# Patient Record
Sex: Female | Born: 1935 | Race: Black or African American | Hispanic: No | Smoking: Never smoker
Health system: Southern US, Community
[De-identification: ages and names within clinical notes are randomized; demographics above are authoritative.]

## PROBLEM LIST (undated history)

## (undated) DIAGNOSIS — R51 Headache: Secondary | ICD-10-CM

## (undated) DIAGNOSIS — N183 Chronic kidney disease, stage 3 unspecified: Secondary | ICD-10-CM

## (undated) DIAGNOSIS — K219 Gastro-esophageal reflux disease without esophagitis: Secondary | ICD-10-CM

## (undated) DIAGNOSIS — R809 Proteinuria, unspecified: Secondary | ICD-10-CM

## (undated) DIAGNOSIS — M7989 Other specified soft tissue disorders: Secondary | ICD-10-CM

## (undated) DIAGNOSIS — C189 Malignant neoplasm of colon, unspecified: Secondary | ICD-10-CM

## (undated) DIAGNOSIS — M1990 Unspecified osteoarthritis, unspecified site: Secondary | ICD-10-CM

## (undated) DIAGNOSIS — I10 Essential (primary) hypertension: Secondary | ICD-10-CM

## (undated) HISTORY — DX: Chronic kidney disease, stage 3 unspecified: N18.30

## (undated) HISTORY — DX: Malignant neoplasm of colon, unspecified: C18.9

## (undated) HISTORY — DX: Essential (primary) hypertension: I10

## (undated) HISTORY — PX: TOTAL ABDOMINAL HYSTERECTOMY W/ BILATERAL SALPINGOOPHORECTOMY: SHX83

## (undated) HISTORY — DX: Chronic kidney disease, stage 3 (moderate): N18.3

## (undated) HISTORY — DX: Proteinuria, unspecified: R80.9

## (undated) HISTORY — DX: Gastro-esophageal reflux disease without esophagitis: K21.9

## (undated) HISTORY — DX: Other specified soft tissue disorders: M79.89

---

## 2001-01-10 ENCOUNTER — Ambulatory Visit (HOSPITAL_COMMUNITY): Admission: RE | Admit: 2001-01-10 | Discharge: 2001-01-10 | Payer: Self-pay | Admitting: Nephrology

## 2001-01-10 ENCOUNTER — Encounter: Payer: Self-pay | Admitting: Nephrology

## 2001-06-20 ENCOUNTER — Encounter: Payer: Self-pay | Admitting: Family Medicine

## 2001-06-20 ENCOUNTER — Ambulatory Visit (HOSPITAL_COMMUNITY): Admission: RE | Admit: 2001-06-20 | Discharge: 2001-06-20 | Payer: Self-pay | Admitting: Family Medicine

## 2001-06-25 ENCOUNTER — Ambulatory Visit (HOSPITAL_COMMUNITY): Admission: RE | Admit: 2001-06-25 | Discharge: 2001-06-25 | Payer: Self-pay | Admitting: Family Medicine

## 2001-06-25 ENCOUNTER — Encounter: Payer: Self-pay | Admitting: Family Medicine

## 2001-06-28 ENCOUNTER — Encounter: Payer: Self-pay | Admitting: General Surgery

## 2001-06-28 ENCOUNTER — Ambulatory Visit (HOSPITAL_COMMUNITY): Admission: RE | Admit: 2001-06-28 | Discharge: 2001-06-28 | Payer: Self-pay | Admitting: General Surgery

## 2001-11-25 ENCOUNTER — Encounter (HOSPITAL_COMMUNITY): Admission: RE | Admit: 2001-11-25 | Discharge: 2001-12-25 | Payer: Self-pay | Admitting: Internal Medicine

## 2001-12-09 ENCOUNTER — Encounter (HOSPITAL_COMMUNITY): Admission: RE | Admit: 2001-12-09 | Discharge: 2002-01-08 | Payer: Self-pay | Admitting: Internal Medicine

## 2001-12-30 ENCOUNTER — Emergency Department (HOSPITAL_COMMUNITY): Admission: EM | Admit: 2001-12-30 | Discharge: 2001-12-31 | Payer: Self-pay | Admitting: *Deleted

## 2002-02-25 ENCOUNTER — Ambulatory Visit (HOSPITAL_COMMUNITY): Admission: RE | Admit: 2002-02-25 | Discharge: 2002-02-25 | Payer: Self-pay | Admitting: Internal Medicine

## 2002-03-25 ENCOUNTER — Encounter: Payer: Self-pay | Admitting: Family Medicine

## 2002-03-25 ENCOUNTER — Ambulatory Visit (HOSPITAL_COMMUNITY): Admission: RE | Admit: 2002-03-25 | Discharge: 2002-03-25 | Payer: Self-pay | Admitting: Family Medicine

## 2002-06-27 ENCOUNTER — Emergency Department (HOSPITAL_COMMUNITY): Admission: EM | Admit: 2002-06-27 | Discharge: 2002-06-28 | Payer: Self-pay | Admitting: *Deleted

## 2002-06-27 ENCOUNTER — Encounter: Payer: Self-pay | Admitting: *Deleted

## 2003-01-09 ENCOUNTER — Emergency Department (HOSPITAL_COMMUNITY): Admission: EM | Admit: 2003-01-09 | Discharge: 2003-01-09 | Payer: Self-pay | Admitting: Emergency Medicine

## 2003-01-30 ENCOUNTER — Ambulatory Visit (HOSPITAL_COMMUNITY): Admission: RE | Admit: 2003-01-30 | Discharge: 2003-01-30 | Payer: Self-pay | Admitting: Family Medicine

## 2003-04-17 ENCOUNTER — Ambulatory Visit (HOSPITAL_COMMUNITY): Admission: RE | Admit: 2003-04-17 | Discharge: 2003-04-17 | Payer: Self-pay | Admitting: Family Medicine

## 2004-10-26 ENCOUNTER — Other Ambulatory Visit: Admission: RE | Admit: 2004-10-26 | Discharge: 2004-10-26 | Payer: Self-pay | Admitting: Internal Medicine

## 2006-04-18 ENCOUNTER — Ambulatory Visit (HOSPITAL_COMMUNITY): Admission: RE | Admit: 2006-04-18 | Discharge: 2006-04-18 | Payer: Self-pay | Admitting: Family Medicine

## 2006-05-14 ENCOUNTER — Ambulatory Visit: Payer: Self-pay | Admitting: Vascular Surgery

## 2007-11-07 ENCOUNTER — Ambulatory Visit (HOSPITAL_COMMUNITY): Admission: RE | Admit: 2007-11-07 | Discharge: 2007-11-07 | Payer: Self-pay | Admitting: Internal Medicine

## 2008-03-12 ENCOUNTER — Emergency Department (HOSPITAL_COMMUNITY): Admission: EM | Admit: 2008-03-12 | Discharge: 2008-03-12 | Payer: Self-pay | Admitting: Emergency Medicine

## 2009-12-15 ENCOUNTER — Ambulatory Visit (HOSPITAL_COMMUNITY): Admission: RE | Admit: 2009-12-15 | Discharge: 2009-12-15 | Payer: Self-pay | Admitting: Internal Medicine

## 2009-12-22 ENCOUNTER — Ambulatory Visit: Payer: Self-pay | Admitting: Cardiology

## 2009-12-22 DIAGNOSIS — R12 Heartburn: Secondary | ICD-10-CM

## 2009-12-22 DIAGNOSIS — I10 Essential (primary) hypertension: Secondary | ICD-10-CM | POA: Insufficient documentation

## 2009-12-22 DIAGNOSIS — R011 Cardiac murmur, unspecified: Secondary | ICD-10-CM

## 2009-12-22 DIAGNOSIS — R079 Chest pain, unspecified: Secondary | ICD-10-CM

## 2009-12-23 ENCOUNTER — Encounter: Payer: Self-pay | Admitting: Internal Medicine

## 2009-12-28 ENCOUNTER — Encounter: Payer: Self-pay | Admitting: Cardiology

## 2009-12-28 ENCOUNTER — Ambulatory Visit: Payer: Self-pay | Admitting: Cardiology

## 2009-12-28 ENCOUNTER — Ambulatory Visit (HOSPITAL_COMMUNITY): Admission: RE | Admit: 2009-12-28 | Discharge: 2009-12-28 | Payer: Self-pay | Admitting: Cardiology

## 2009-12-31 ENCOUNTER — Encounter: Payer: Self-pay | Admitting: Cardiology

## 2010-01-27 ENCOUNTER — Ambulatory Visit: Payer: Self-pay | Admitting: Cardiology

## 2010-04-11 ENCOUNTER — Encounter: Payer: Self-pay | Admitting: Internal Medicine

## 2010-04-19 NOTE — Assessment & Plan Note (Signed)
Summary: 1 mth f/u per checkout on 12/22/09/tg   Visit Type:  Follow-up Primary Provider:  Dr. Elfredia Nevins   History of Present Illness: 77 year old woman presents for followup. She was seen back in October in the setting of chest discomfort as outlined previously. Symptoms were fairly atypical, possibly gastrointestinal in etiology. Antiacid medication was recommended. Patient was also referred for a followup echocardiogram as reviewed below.  She reports improvement in symptoms, no progressive chest pain or breathlessness. Continues to have problems with chronic lower extremity edema, on antihypertensives under the direction of Dr. Kristian Covey.  Today I reviewed her echocardiogram. At this point would anticipate medical therapy and observation, no further cardiac testing.  Current Medications (verified): 1)  Amlodipine Besylate 10 Mg Tabs (Amlodipine Besylate) .... Take 1/2 Tab Daily 2)  Captopril 50 Mg Tabs (Captopril) .... Take 1 1/2 Tabs Daily 3)  Furosemide 20 Mg Tabs (Furosemide) .... Take Prn 4)  Losartan Potassium-Hctz 100-25 Mg Tabs (Losartan Potassium-Hctz) .... Take 1 Tab Daily 5)  Acetaminophen 500 Mg Caps (Acetaminophen) .... As Needed  Allergies: 1)  ! Sulfa 2)  ! Pcn 3)  ! Asa 4)  ! Codeine  Past History:  Past Medical History: Last updated: 12/22/2009 Proteinuria Hypertension Possible G E R D  Social History: Last updated: 12/21/2009 Tobacco Use - No.  Alcohol Use - no Regular Exercise - no Drug Use - no  Review of Systems       The patient complains of peripheral edema.  The patient denies anorexia, fever, chest pain, syncope, dyspnea on exertion, melena, and hematochezia.         Otherwise reviewed and negative.  Vital Signs:  Patient profile:   77 year old female Weight:      198 pounds O2 Sat:      98 % on Room air Pulse rate:   76 / minute BP sitting:   150 / 65  (left arm)  Vitals Entered By: Larita Fife Via LPN (January 27, 2010 1:25 PM)  O2  Flow:  Room air  Physical Exam  Additional Exam:  Overweight woman in no acute distress. HEENT: Conjunctiva and lids normal, oropharynx clear. Neck: Supple, no elevated JVP or carotid bruits, no thyromegaly. Lungs: Clear to auscultation, nonlabored. Cardiac: Regular rate and rhythm, 2/6 systolic murmur at the base, preserved second heart sound, no S3 gallop or rub. Musculoskeletal: No kyphosis. Extremities: Chronic appearing bilateral lower extremity edema and venous stasis, progressive since last visit. Neuropsychiatric: Alert and oriented x3, affect appropriate.   Echocardiogram  Procedure date:  12/28/2009  Findings:      Study Conclusions            - Left ventricle: The cavity size was normal.with borderline LVH.       Systolic function was hyperdynamic. The estimated ejection       fraction was 75%. Wall motion was normal; there were no regional       wall motion abnormalities.     - Aortic valve: Mildly calcified annulus. Trileaflet; normal       thickness leaflets.     - Atrial septum: There was a medium-sized atrial septal aneurysm.  Impression & Recommendations:  Problem # 1:  CHEST PAIN (ICD-786.50)  Atypical, resolved. Suspect gastrointestinal etiology such as reflux. LVEF is normal by echocardiogram. Anticipate observation on medical therapy. No further cardiac testing at this time. We can see her back as needed.  Her updated medication list for this problem includes:    Amlodipine Besylate  10 Mg Tabs (Amlodipine besylate) .Marland Kitchen... Take 1/2 tab daily    Captopril 50 Mg Tabs (Captopril) .Marland Kitchen... Take 1 1/2 tabs daily  Problem # 2:  CARDIAC MURMUR (ICD-785.2)  Normal LVEF without significant valvular abnormalities. Likely flow murmur in the setting of hypertension, borderline LVH, and hyperdynamic contraction.  Her updated medication list for this problem includes:    Captopril 50 Mg Tabs (Captopril) .Marland Kitchen... Take 1 1/2 tabs daily    Furosemide 20 Mg Tabs (Furosemide)  .Marland Kitchen... Take prn    Losartan Potassium-hctz 100-25 Mg Tabs (Losartan potassium-hctz) .Marland Kitchen... Take 1 tab daily  Patient Instructions: 1)  Your physician recommends that you schedule a follow-up appointment in: as needed  2)  Your physician recommends that you continue on your current medications as directed. Please refer to the Current Medication list given to you today.  Prevention & Chronic Care Immunizations   Influenza vaccine: Not documented    Tetanus booster: Not documented    Pneumococcal vaccine: Not documented    H. zoster vaccine: Not documented  Colorectal Screening   Hemoccult: Not documented    Colonoscopy: Not documented  Other Screening   Pap smear: Not documented    Mammogram: Not documented    DXA bone density scan: Not documented   Smoking status: never  (12/21/2009)  Lipids   Total Cholesterol: Not documented   LDL: Not documented   LDL Direct: Not documented   HDL: Not documented   Triglycerides: Not documented  Hypertension   Last Blood Pressure: 150 / 65  (01/27/2010)   Serum creatinine: Not documented   Serum potassium Not documented  Self-Management Support :    Hypertension self-management support: Not documented

## 2010-04-19 NOTE — Letter (Signed)
Summary: [place] Results Engineer, agricultural at [facility]  [address], [place] [address]   Phone: [phone]  Fax: [phone]      December 31, 2009 MRN: [id number]   Vicki Woods [address] [place], [place]  [address]   Dear [name],  Your test ordered by Selena Batten has been reviewed by your physician (or physician assistant) and was found to be normal or stable. Your physician (or physician assistant) felt no changes were needed at this time.  __X__ Echocardiogram  ____ Cardiac Stress Test  ____ Lab Work  ____ Peripheral vascular study of arms, legs or neck  ____ CT scan or X-ray  ____ Lung or Breathing test  ____ Other:  Please continue on current medical treatment.  Thank you.   Nona Dell, MD, F.A.C.C

## 2010-04-19 NOTE — Assessment & Plan Note (Signed)
Summary: ***NP36 CHEST PAIN   Visit Type:  Initial Consult Primary Provider:  Dr. Elfredia Nevins   History of Present Illness: 77 year old woman referred for cardiology consultation. She reports recent 2 week history of intermittent "heaviness" in her mid thoracic to neck area, made better by stirring around and moving, as well as belching. She also describes a "bubbly" feeling in this area. She has been suspicious that it might be related to "reflux.  She does not endorse any exertional component to these symptoms, describing herself as still being very active. She push mows her yard with a self-propelled mower, also does all of her indoor cleaning and activities of daily living. Reports no exertional chest pain or unusual shortness of breath beyond NYHA class II. No palpitations or syncope.  She states that she saw Dr. Dietrich Pates many years ago and underwent stress testing related to findings of a right bundle branch block on electrocardiogram. She reports no specific problems over time or need for recurrent cardiac testing. Record review finds report of an EKG from April 2004 showing right bundle branch block.  She was given Gas-X for treatment, has not taken an acid lowering medicine as yet  Current Medications (verified): 1)  Amlodipine Besylate 10 Mg Tabs (Amlodipine Besylate) .... Take 1/2 Tab Daily 2)  Captopril 50 Mg Tabs (Captopril) .... Take 1 1/2 Tabs Daily 3)  Furosemide 20 Mg Tabs (Furosemide) .... Take Prn 4)  Losartan Potassium-Hctz 100-25 Mg Tabs (Losartan Potassium-Hctz) .... Take 1 Tab Daily 5)  Clobetasol Propionate 0.05 % Crea (Clobetasol Propionate) .... Apply As Needed  Allergies (verified): 1)  ! Sulfa 2)  ! Pcn 3)  ! Asa 4)  ! Codeine  Comments:  Nurse/Medical Assistant: patient brought meds she has 10 mg of amlodipine she only takes 1/2 of the tab daily,Dr.befakadu has patient on furosemide 20 mg she only takes this med as needed .   Past History:  Family  History: Last updated: 12/22/2009 Noncontributory for premature cardiovascular disease Mother: died with ovarian cancer Siblings: Brother died with lung cancer  Social History: Last updated: 12/21/2009 Tobacco Use - No.  Alcohol Use - no Regular Exercise - no Drug Use - no  Past Medical History: Proteinuria Hypertension Possible G E R D  Past Surgical History: Hysterectomy Bilateral salpingo-oophorectomy  Family History: Noncontributory for premature cardiovascular disease Mother: died with ovarian cancer Siblings: Brother died with lung cancer  Review of Systems       The patient complains of peripheral edema.  The patient denies anorexia, fever, weight loss, syncope, dyspnea on exertion, prolonged cough, hemoptysis, melena, and hematochezia.         Reports chronic problems with lower extremity edema. Also proteinuria, followed by Dr. Kristian Covey. Otherwise reviewed and negative except as outlined above.  Vital Signs:  Patient profile:   77 year old female Height:      60 inches Weight:      196 pounds BMI:     38.42 Pulse rate:   83 / minute BP sitting:   179 / 74  (right arm)  Vitals Entered By: Dreama Saa, CNA (December 22, 2009 9:30 AM)  Physical Exam  Additional Exam:  Overweight woman in no acute distress. HEENT: Conjunctiva and lids normal, oropharynx clear. Neck: Supple, no elevated JVP or carotid bruits, no thyromegaly. Lungs: Clear to auscultation, nonlabored. Cardiac: Regular rate and rhythm, 2/6 systolic murmur at the base, preserved second heart sound, no S3 gallop or rub. Abdomen: Soft, nontender, bowel sounds  present. Skin: Warm and dry. Musculoskeletal: No kyphosis. Extremities: Trace ankle edema, symmetrical, distal pulses full. Neuropsychiatric: Alert and oriented x3, affect appropriate.   CXR  Procedure date:  12/15/2009  Findings:       Findings:   Nonobstructive bowel gas pattern.   Air filled nondistended loops of large and small  bowel.   Gas and stool present in rectum.   No bowel wall thickening.   Mild osseous demineralization.   No urinary tract calcification.    IMPRESSION:   Normal bowel gas pattern.  Carotid Doppler  Procedure date:  06/20/2001  Findings:      IMPRESSION:  1.  FOCAL, CALCIFIED PLAQUE IN THE PROXIMAL   LEFT INTERNAL CAROTID ARTERY WITHOUT SIGNIFICANT LUMINAL   NARROWING OR FLOW REDUCTION.   2.  OTHERWISE, NORMAL APPEARING CAROTID AND VERTEBRAL   ARTERIES.  EKG  Procedure date:  12/22/2009  Findings:      Sinus rhythm at 75 beats per minute with left atrial enlargement and right bundle branch block pattern.  Impression & Recommendations:  Problem # 1:  CHEST PAIN (ICD-786.50)  Atypical in description, seems most consistent with a gastrointestinal etiology, perhaps reflux. She does not endorse any truly exertional symptoms. Electrocardiogram is abnormal chronically showing right bundle branch block pattern. I asked her to try an over-the-counter antacid medication over the next few weeks to see if this helps improve her symptoms. Office follow up arranged in the month for review. At this point no further ischemic testing is being pursued.  Her updated medication list for this problem includes:    Amlodipine Besylate 10 Mg Tabs (Amlodipine besylate) .Marland Kitchen... Take 1/2 tab daily    Captopril 50 Mg Tabs (Captopril) .Marland Kitchen... Take 1 1/2 tabs daily  Orders: 2-D Echocardiogram (2D Echo)  Problem # 2:  HYPERTENSION (ICD-401.9)  Followed by Dr. Sherwood Gambler and Dr. Kristian Covey.  Her updated medication list for this problem includes:    Amlodipine Besylate 10 Mg Tabs (Amlodipine besylate) .Marland Kitchen... Take 1/2 tab daily    Captopril 50 Mg Tabs (Captopril) .Marland Kitchen... Take 1 1/2 tabs daily    Furosemide 20 Mg Tabs (Furosemide) .Marland Kitchen... Take prn    Losartan Potassium-hctz 100-25 Mg Tabs (Losartan potassium-hctz) .Marland Kitchen... Take 1 tab daily  Problem # 3:  CARDIAC MURMUR (ICD-785.2)  A followup 2-D echocardiogram will  be obtained.  Her updated medication list for this problem includes:    Captopril 50 Mg Tabs (Captopril) .Marland Kitchen... Take 1 1/2 tabs daily    Furosemide 20 Mg Tabs (Furosemide) .Marland Kitchen... Take prn    Losartan Potassium-hctz 100-25 Mg Tabs (Losartan potassium-hctz) .Marland Kitchen... Take 1 tab daily  Patient Instructions: 1)  Your physician recommends that you schedule a follow-up appointment in: 1 month 2)  Your physician has recommended you make the following change in your medication: Start taking an over the counter antacid such as  Pepsid, Zantac, Tagamet, Omeprazole, Tums, etc. 3)  Your physician has requested that you have an echocardiogram.  Echocardiography is a painless test that uses sound waves to create images of your heart. It provides your doctor with information about the size and shape of your heart and how well your heart's chambers and valves are working.  This procedure takes approximately one hour. There are no restrictions for this procedure.

## 2010-08-05 NOTE — Procedures (Signed)
   NAME:  Vicki Woods, Vicki Woods                      ACCOUNT NO.:  [id number]   MEDICAL RECORD NO.:  [id number]                   PATIENT TYPE:  EMS   LOCATION:  ED                                   FACILITY:  [facility]   PHYSICIAN:  Edward L. Juanetta Gosling, M.D.             DATE OF BIRTH:  24-Jul-1931   DATE OF PROCEDURE:  06/27/2002  DATE OF DISCHARGE:  06/28/2002                                EKG INTERPRETATION   DATE AND TIME OF TEST:  June 27, 2002 at 2243.   FINDINGS:  The rhythm is sinus rhythm with a rate in the 80s.  There is a  right bundle-branch block.  There are also PVCs.   IMPRESSION:  Abnormal electrocardiogram.                                               Edward L. Juanetta Gosling, M.D.    ELH/MEDQ  D:  06/30/2002  T:  06/30/2002  Job:  [id number]

## 2010-08-05 NOTE — Procedures (Signed)
   NAME:  CLOTEE, SCHLICKER                      ACCOUNT NO.:  [id number]   MEDICAL RECORD NO.:  [id number]                   PATIENT TYPE:  EMS   LOCATION:  ED                                   FACILITY:  [facility]   PHYSICIAN:  Edward L. Juanetta Gosling, M.D.             DATE OF BIRTH:  [birth year]   DATE OF PROCEDURE:  06/28/2002  DATE OF DISCHARGE:  06/28/2002                                EKG INTERPRETATION   DATE AND TIME OF TEST:  June 28, 2002 at 0010.   FINDINGS:  The rhythm is sinus rhythm with a rate in the 80s.  There is a  right bundle-branch block.   IMPRESSION:  Abnormal electrocardiogram.                                               Edward L. Juanetta Gosling, M.D.    ELH/MEDQ  D:  07/03/2002  T:  07/04/2002  Job:  [id number]

## 2011-03-16 ENCOUNTER — Encounter: Payer: Self-pay | Admitting: Cardiology

## 2011-06-19 ENCOUNTER — Other Ambulatory Visit: Payer: Self-pay

## 2011-06-19 ENCOUNTER — Encounter (HOSPITAL_COMMUNITY): Payer: Self-pay | Admitting: *Deleted

## 2011-06-19 ENCOUNTER — Inpatient Hospital Stay (HOSPITAL_COMMUNITY)
Admission: EM | Admit: 2011-06-19 | Discharge: 2011-06-23 | DRG: 375 | Disposition: A | Discharge status: Home / Self Care | Payer: Medicare Other | Attending: Internal Medicine | Admitting: Internal Medicine

## 2011-06-19 DIAGNOSIS — E669 Obesity, unspecified: Secondary | ICD-10-CM | POA: Diagnosis present

## 2011-06-19 DIAGNOSIS — C183 Malignant neoplasm of hepatic flexure: Principal | ICD-10-CM | POA: Diagnosis present

## 2011-06-19 DIAGNOSIS — K7689 Other specified diseases of liver: Secondary | ICD-10-CM | POA: Diagnosis present

## 2011-06-19 DIAGNOSIS — D649 Anemia, unspecified: Secondary | ICD-10-CM | POA: Diagnosis present

## 2011-06-19 DIAGNOSIS — K922 Gastrointestinal hemorrhage, unspecified: Secondary | ICD-10-CM | POA: Diagnosis present

## 2011-06-19 DIAGNOSIS — K2100 Gastro-esophageal reflux disease with esophagitis, without bleeding: Secondary | ICD-10-CM | POA: Diagnosis present

## 2011-06-19 DIAGNOSIS — E876 Hypokalemia: Secondary | ICD-10-CM | POA: Diagnosis present

## 2011-06-19 DIAGNOSIS — I10 Essential (primary) hypertension: Secondary | ICD-10-CM | POA: Diagnosis present

## 2011-06-19 DIAGNOSIS — D128 Benign neoplasm of rectum: Secondary | ICD-10-CM | POA: Diagnosis present

## 2011-06-19 DIAGNOSIS — D126 Benign neoplasm of colon, unspecified: Secondary | ICD-10-CM | POA: Diagnosis present

## 2011-06-19 DIAGNOSIS — Z6835 Body mass index (BMI) 35.0-35.9, adult: Secondary | ICD-10-CM

## 2011-06-19 DIAGNOSIS — D62 Acute posthemorrhagic anemia: Secondary | ICD-10-CM | POA: Diagnosis present

## 2011-06-19 DIAGNOSIS — Z79899 Other long term (current) drug therapy: Secondary | ICD-10-CM

## 2011-06-19 DIAGNOSIS — D131 Benign neoplasm of stomach: Secondary | ICD-10-CM | POA: Diagnosis present

## 2011-06-19 DIAGNOSIS — K5730 Diverticulosis of large intestine without perforation or abscess without bleeding: Secondary | ICD-10-CM | POA: Diagnosis present

## 2011-06-19 DIAGNOSIS — Z880 Allergy status to penicillin: Secondary | ICD-10-CM

## 2011-06-19 HISTORY — DX: Headache: R51

## 2011-06-19 HISTORY — DX: Unspecified osteoarthritis, unspecified site: M19.90

## 2011-06-19 LAB — COMPREHENSIVE METABOLIC PANEL
Albumin: 4 g/dL (ref 3.5–5.2)
BUN: 20 mg/dL (ref 6–23)
CO2: 25 mEq/L (ref 19–32)
Calcium: 10.1 mg/dL (ref 8.4–10.5)
Chloride: 105 mEq/L (ref 96–112)
Creatinine, Ser: 0.86 mg/dL (ref 0.50–1.10)
GFR calc Af Amer: 74 mL/min — ABNORMAL LOW (ref >90)
Total Bilirubin: 0.4 mg/dL (ref 0.3–1.2)

## 2011-06-19 LAB — CBC
HCT: 33.3 % — ABNORMAL LOW (ref 36.0–46.0)
Hemoglobin: 9.8 g/dL — ABNORMAL LOW (ref 12.0–15.0)
MCH: 28.7 pg (ref 26.0–34.0)
MCV: 87.6 fL (ref 78.0–100.0)
MCV: 87.1 fL (ref 78.0–100.0)
Platelets: 224 10*3/uL (ref 150–400)
RBC: 3.33 MIL/uL — ABNORMAL LOW (ref 3.87–5.11)
RDW: 16.4 % — ABNORMAL HIGH (ref 11.5–15.5)
WBC: 8.5 10*3/uL (ref 4.0–10.5)
WBC: 7.2 10*3/uL (ref 4.0–10.5)

## 2011-06-19 LAB — TYPE AND SCREEN
DAT, IgG: NEGATIVE
PT AG Type: NEGATIVE

## 2011-06-19 MED ORDER — SODIUM CHLORIDE 0.9 % IV SOLN: INTRAVENOUS | Status: DC

## 2011-06-19 MED ORDER — SODIUM CHLORIDE 0.9 % IJ SOLN
INTRAMUSCULAR | Status: AC
  Administered 2011-06-19: 10 mL
  Filled 2011-06-19: qty 3

## 2011-06-19 MED ORDER — SODIUM CHLORIDE 0.9 % IJ SOLN
3.0000 mL | Freq: Two times a day (BID) | INTRAMUSCULAR | Status: DC
  Administered 2011-06-19 – 2011-06-23 (×8): 3 mL via INTRAVENOUS
  Filled 2011-06-19 (×9): qty 3

## 2011-06-19 MED ORDER — AMLODIPINE BESYLATE 5 MG PO TABS
5.0000 mg | ORAL_TABLET | Freq: Every day | ORAL | Status: DC
  Administered 2011-06-19 – 2011-06-23 (×5): 5 mg via ORAL
  Filled 2011-06-19 (×5): qty 1

## 2011-06-19 MED ORDER — PANTOPRAZOLE SODIUM 40 MG IV SOLR
40.0000 mg | Freq: Two times a day (BID) | INTRAVENOUS | Status: DC
  Administered 2011-06-19 – 2011-06-23 (×8): 40 mg via INTRAVENOUS
  Filled 2011-06-19 (×8): qty 40

## 2011-06-19 MED ORDER — ONDANSETRON HCL 4 MG/2ML IJ SOLN: 4.0000 mg | Freq: Four times a day (QID) | INTRAMUSCULAR | Status: DC | PRN

## 2011-06-19 MED ORDER — LOSARTAN POTASSIUM-HCTZ 100-25 MG PO TABS: 1.0000 | ORAL_TABLET | Freq: Every day | ORAL | Status: DC

## 2011-06-19 MED ORDER — HYDROCHLOROTHIAZIDE 25 MG PO TABS
25.0000 mg | ORAL_TABLET | Freq: Every day | ORAL | Status: DC
  Administered 2011-06-19 – 2011-06-23 (×5): 25 mg via ORAL
  Filled 2011-06-19 (×5): qty 1

## 2011-06-19 MED ORDER — LOSARTAN POTASSIUM 50 MG PO TABS
100.0000 mg | ORAL_TABLET | Freq: Every day | ORAL | Status: DC
  Administered 2011-06-19 – 2011-06-23 (×5): 100 mg via ORAL
  Filled 2011-06-19 (×5): qty 2

## 2011-06-19 MED ORDER — ACETAMINOPHEN 650 MG RE SUPP: 650.0000 mg | Freq: Four times a day (QID) | RECTAL | Status: DC | PRN

## 2011-06-19 MED ORDER — ACETAMINOPHEN 325 MG PO TABS: 650.0000 mg | ORAL_TABLET | Freq: Four times a day (QID) | ORAL | Status: DC | PRN

## 2011-06-19 MED ORDER — SODIUM CHLORIDE 0.9 % IV SOLN
INTRAVENOUS | Status: DC
  Administered 2011-06-19: 17:00:00 via INTRAVENOUS

## 2011-06-19 MED ORDER — POTASSIUM CHLORIDE CRYS ER 20 MEQ PO TBCR
40.0000 meq | EXTENDED_RELEASE_TABLET | Freq: Once | ORAL | Status: AC
  Administered 2011-06-19: 40 meq via ORAL
  Filled 2011-06-19: qty 1

## 2011-06-19 MED ORDER — SODIUM CHLORIDE 0.9 % IV SOLN
80.0000 mg | Freq: Once | INTRAVENOUS | Status: AC
  Administered 2011-06-19: 80 mg via INTRAVENOUS
  Filled 2011-06-19: qty 80

## 2011-06-19 MED ORDER — CAPTOPRIL 25 MG PO TABS
75.0000 mg | ORAL_TABLET | Freq: Every day | ORAL | Status: DC
  Administered 2011-06-20 – 2011-06-23 (×4): 75 mg via ORAL
  Filled 2011-06-19 (×4): qty 3

## 2011-06-19 MED ORDER — ONDANSETRON HCL 4 MG PO TABS: 4.0000 mg | ORAL_TABLET | Freq: Four times a day (QID) | ORAL | Status: DC | PRN

## 2011-06-19 MED ORDER — PANTOPRAZOLE SODIUM 40 MG IV SOLR
INTRAVENOUS | Status: AC
  Filled 2011-06-19: qty 80

## 2011-06-19 NOTE — ED Notes (Signed)
Patient is comfortable. Patient was assisted to the bathroom and back to bed. Tolerated well.

## 2011-06-19 NOTE — Plan of Care (Signed)
Problem: Phase I Progression Outcomes Goal: OOB as tolerated unless otherwise ordered Outcome: Completed/Met Date Met:  06/19/11 With one person contact assist

## 2011-06-19 NOTE — ED Provider Notes (Addendum)
History   This chart was scribed for Hilario Quarry, MD by Brooks Sailors. The patient was seen in room APA09/APA09. Patient's care was started at 1028.   CSN: [id number]  Arrival date & time 06/19/11  1028   First MD Initiated Contact with Patient 06/19/11 1151      Chief Complaint  Patient presents with  . Rectal Bleeding    (Consider location/radiation/quality/duration/timing/severity/associated sxs/prior treatment) HPI  Vicki Woods is a 78 y.o. female who presents to the Emergency Department complaining of constant moderate blood in stool onset four days ago and persistent since. Patient has had around two bowel movements each day always with blood mixed in. Denies diabetes and soreness. Patient had history of hemorrhoids 30 years ago. Patient with history of GERD, HTN.      Past Medical History  Diagnosis Date  . Hypertension   . GERD (gastroesophageal reflux disease)   . Proteinuria     Past Surgical History  Procedure Date  . Total abdominal hysterectomy w/ bilateral salpingoophorectomy     Family History  Problem Relation Age of Onset  . Ovarian cancer Mother   . Lung cancer Brother     History  Substance Use Topics  . Smoking status: Unknown If Ever Smoked  . Smokeless tobacco: Not on file  . Alcohol Use: No    OB History    Grav Para Term Preterm Abortions TAB SAB Ect Mult Living                  Review of Systems A complete 10 system review of systems was obtained and all systems are negative except as noted in the HPI and PMH.   Allergies  Aspirin; Codeine; Penicillins; and Sulfonamide derivatives  Home Medications   Current Outpatient Rx  Name Route Sig Dispense Refill  . ACETAMINOPHEN 500 MG PO TABS Oral Take 500 mg by mouth as needed.      Marland Kitchen AMLODIPINE BESYLATE 10 MG PO TABS Oral Take 5 mg by mouth daily.      Marland Kitchen CAPTOPRIL 50 MG PO TABS Oral Take 75 mg by mouth daily.      . FUROSEMIDE 20 MG PO TABS Oral Take 20 mg by mouth as  needed.      Marland Kitchen LOSARTAN POTASSIUM-HCTZ 100-25 MG PO TABS Oral Take 1 tablet by mouth daily.        BP 182/68  Pulse 80  Temp(Src) 97.7 F (36.5 C) (Oral)  Resp 20  Ht 5' (1.524 m)  Wt 180 lb (81.647 kg)  BMI 35.15 kg/m2  SpO2 100%  Physical Exam  Nursing note and vitals reviewed. Constitutional: She is oriented to person, place, and time. She appears well-developed and well-nourished. No distress.  HENT:  Head: Normocephalic and atraumatic.  Eyes: EOM are normal. Pupils are equal, round, and reactive to light.  Neck: Neck supple. No tracheal deviation present.  Cardiovascular: Normal rate, regular rhythm and normal heart sounds.   Pulmonary/Chest: Effort normal and breath sounds normal. No respiratory distress. She has no wheezes.  Abdominal: Soft. She exhibits no distension.  Musculoskeletal: Normal range of motion. She exhibits edema (Chronic, lower extremities ).  Neurological: She is alert and oriented to person, place, and time. No sensory deficit.  Skin: Skin is warm and dry.  Psychiatric: She has a normal mood and affect. Her behavior is normal.    ED Course  Procedures (including critical care time) DIAGNOSTIC STUDIES: Oxygen Saturation is 100% on room air, normal  by my interpretation.    COORDINATION OF CARE: 11:55AM - Patient informed of current plan for treatment and evaluation and agrees with plan at this time.     Labs Reviewed - No data to display No results found.   No diagnosis found.    MDM    Results for orders placed during the hospital encounter of 06/19/11  OCCULT BLOOD, POC DEVICE      Component Value Range   Fecal Occult Bld POSITIVE    PROTIME-INR      Component Value Range   Prothrombin Time 13.4  11.6 - 15.2 (seconds)   INR 1.00  0.00 - 1.49   CBC      Component Value Range   WBC 8.5  4.0 - 10.5 (K/uL)   RBC 3.80 (*) 3.87 - 5.11 (MIL/uL)   Hemoglobin 10.9 (*) 12.0 - 15.0 (g/dL)   HCT 16.1 (*) 09.6 - 46.0 (%)   MCV 87.6  78.0 -  100.0 (fL)   MCH 28.7  26.0 - 34.0 (pg)   MCHC 32.7  30.0 - 36.0 (g/dL)   RDW 04.5 (*) 40.9 - 15.5 (%)   Platelets 271  150 - 400 (K/uL)  COMPREHENSIVE METABOLIC PANEL      Component Value Range   Sodium 141  135 - 145 (mEq/L)   Potassium 3.4 (*) 3.5 - 5.1 (mEq/L)   Chloride 105  96 - 112 (mEq/L)   CO2 25  19 - 32 (mEq/L)   Glucose, Bld 95  70 - 99 (mg/dL)   BUN 20  6 - 23 (mg/dL)   Creatinine, Ser 8.11  0.50 - 1.10 (mg/dL)   Calcium 91.4  8.4 - 10.5 (mg/dL)   Total Protein 7.7  6.0 - 8.3 (g/dL)   Albumin 4.0  3.5 - 5.2 (g/dL)   AST 18  0 - 37 (U/L)   ALT 10  0 - 35 (U/L)   Alkaline Phosphatase 79  39 - 117 (U/L)   Total Bilirubin 0.4  0.3 - 1.2 (mg/dL)   GFR calc non Af Amer 64 (*) >90 (mL/min)   GFR calc Af Amer 74 (*) >90 (mL/min)  TYPE AND SCREEN      Component Value Range   ABO/RH(D) O POS     Antibody Screen PENDING     Sample Expiration 06/22/2011     Patient presents with rectal bleeding. She appears hemodynamically stable. Hemoglobin is slightly decreased at 10.9. She is not anticoagulated and her INR is 1. Patient's primary care doctor has been Dr. Nobie Putnam. She has been referred to Dr. Karilyn Cota in the past.  She was evaluated by him for weight loss 4 years ago. She has not had a colonoscopy in the past. She does not see Dr. Karilyn Cota on a regular basis Patient's care care discussed with Dr. Kerry Hough and patient will be admitted to telemetry bed. I personally performed the services described in this documentation, which was scribed in my presence. The recorded information has been reviewed and considered.    Hilario Quarry, MD 06/19/11 1401  Hilario Quarry, MD 06/19/11 [phone]

## 2011-06-19 NOTE — ED Notes (Signed)
Pt states bleeding noted started on Thursday with BM. Bleeding has continued and has become dark red, black.

## 2011-06-19 NOTE — Progress Notes (Signed)
1:31 PM  Date: 06/19/2011  Rate: 79  Rhythm: normal sinus rhythm  QRS Axis: normal  Intervals: normal  ST/T Wave abnormalities: normal  Conduction Disutrbances:right bundle branch block  Narrative Interpretation: Abnormal EKG.  Old EKG Reviewed: none available

## 2011-06-19 NOTE — H&P (Signed)
PCP:   Colette Ribas, MD, MD   Chief Complaint:  Rectal bleeding  HPI: This is a very pleasant 78 year old female with past medical history of hypertension, GERD, obesity. Patient presents for hospital today with complaints of bloody stools. She reports onset of symptoms approximately 4 days ago. She had noted some dark-colored blood in her stools. This has persisted since then with her last bowel movement being last night. She's not had any further bleeding since last night. She does report some dizziness on standing. Denies any chest pain, shortness of breath. She has occasional heartburn. She denies any NSAID use. She denies ever having a colonoscopy or upper endoscopy before. She was evaluated in the emergency room where she was found to have guaiac positive stools. She's been referred for admission.  Allergies:   Allergies  Allergen Reactions  . Aspirin   . Codeine   . Penicillins   . Sulfonamide Derivatives       Past Medical History  Diagnosis Date  . Hypertension   . GERD (gastroesophageal reflux disease)   . Proteinuria     Past Surgical History  Procedure Date  . Total abdominal hysterectomy w/ bilateral salpingoophorectomy     Prior to Admission medications   Medication Sig Start Date End Date Taking? Authorizing Provider  acetaminophen (TYLENOL) 500 MG tablet Take 500 mg by mouth as needed.      Historical Provider, MD  amLODipine (NORVASC) 10 MG tablet Take 5 mg by mouth daily.      Historical Provider, MD  captopril (CAPOTEN) 50 MG tablet Take 75 mg by mouth daily.      Historical Provider, MD  furosemide (LASIX) 20 MG tablet Take 20 mg by mouth as needed.      Historical Provider, MD  losartan-hydrochlorothiazide (HYZAAR) 100-25 MG per tablet Take 1 tablet by mouth daily.      Historical Provider, MD    Social History:  has an unknown smoking status. She does not have any smokeless tobacco history on file. She reports that she does not drink alcohol or  use illicit drugs.  Family History  Problem Relation Age of Onset  . Ovarian cancer Mother   . Lung cancer Brother     Review of Systems: Positives in bold Constitutional: Denies fever, chills, diaphoresis, appetite change and fatigue.  HEENT: Denies photophobia, eye pain, redness, hearing loss, ear pain, congestion, sore throat, rhinorrhea, sneezing, mouth sores, trouble swallowing, neck pain, neck stiffness and tinnitus.   Respiratory: Denies SOB, DOE, cough, chest tightness,  and wheezing.   Cardiovascular: Denies chest pain, palpitations and leg swelling.  Gastrointestinal: Denies nausea, vomiting, abdominal pain, diarrhea, constipation, blood in stool and abdominal distention.  Genitourinary: Denies dysuria, urgency, frequency, hematuria, flank pain and difficulty urinating.  Musculoskeletal: Denies myalgias, back pain, joint swelling, arthralgias and gait problem.  Skin: Denies pallor, rash and wound.  Neurological: Denies dizziness, seizures, syncope, weakness, light-headedness, numbness and headaches.  Hematological: Denies adenopathy. Easy bruising, personal or family bleeding history  Psychiatric/Behavioral: Denies suicidal ideation, mood changes, confusion, nervousness, sleep disturbance and agitation   Physical Exam: Blood pressure 149/53, pulse 68, temperature 97.7 F (36.5 C), temperature source Oral, resp. rate 13, height 5' (1.524 m), weight 81.647 kg (180 lb), SpO2 98.00%. General: Patient is lying in bed, in no acute distress, alert and awake x3 HEENT: Normocephalic, atraumatic, pupils are equal round reactive to light and accommodation Neck: Supple Chest: Clear to auscultation bilaterally Cardiac: S1, S2, regular rate and rhythm Abdomen: Soft,  nontender, bowel sounds are active Extremities: Chronic edema bilaterally Neurologic: Grossly intact, nonfocal  Labs on Admission:  Results for orders placed during the hospital encounter of 06/19/11 (from the past 48  hour(s))  PROTIME-INR     Status: Normal   Collection Time   06/19/11 11:50 AM      Component Value Range Comment   Prothrombin Time 13.4  11.6 - 15.2 (seconds)    INR 1.00  0.00 - 1.49    CBC     Status: Abnormal   Collection Time   06/19/11 11:50 AM      Component Value Range Comment   WBC 8.5  4.0 - 10.5 (K/uL)    RBC 3.80 (*) 3.87 - 5.11 (MIL/uL)    Hemoglobin 10.9 (*) 12.0 - 15.0 (g/dL)    HCT 16.1 (*) 09.6 - 46.0 (%)    MCV 87.6  78.0 - 100.0 (fL)    MCH 28.7  26.0 - 34.0 (pg)    MCHC 32.7  30.0 - 36.0 (g/dL)    RDW 04.5 (*) 40.9 - 15.5 (%)    Platelets 271  150 - 400 (K/uL)   COMPREHENSIVE METABOLIC PANEL     Status: Abnormal   Collection Time   06/19/11 11:50 AM      Component Value Range Comment   Sodium 141  135 - 145 (mEq/L)    Potassium 3.4 (*) 3.5 - 5.1 (mEq/L)    Chloride 105  96 - 112 (mEq/L)    CO2 25  19 - 32 (mEq/L)    Glucose, Bld 95  70 - 99 (mg/dL)    BUN 20  6 - 23 (mg/dL)    Creatinine, Ser 8.11  0.50 - 1.10 (mg/dL)    Calcium 91.4  8.4 - 10.5 (mg/dL)    Total Protein 7.7  6.0 - 8.3 (g/dL)    Albumin 4.0  3.5 - 5.2 (g/dL)    AST 18  0 - 37 (U/L)    ALT 10  0 - 35 (U/L)    Alkaline Phosphatase 79  39 - 117 (U/L)    Total Bilirubin 0.4  0.3 - 1.2 (mg/dL)    GFR calc non Af Amer 64 (*) >90 (mL/min)    GFR calc Af Amer 74 (*) >90 (mL/min)   OCCULT BLOOD, POC DEVICE     Status: Normal   Collection Time   06/19/11 11:59 AM      Component Value Range Comment   Fecal Occult Bld POSITIVE     TYPE AND SCREEN     Status: Normal   Collection Time   06/19/11 12:25 PM      Component Value Range Comment   ABO/RH(D) O POS      Antibody Screen POS      Sample Expiration 06/22/2011       Radiological Exams on Admission: No results found.  Assessment/Plan Principal Problem:  *GI bleed Active Problems:  HYPERTENSION  Anemia  Obesity  Hypokalemia  Plan:  Patient will be admitted to a telemetry bed. She will have serial CBCs. We will ask gastroenterology to  see her in consultation. She'll be continued on proton pump inhibitors. We'll keep her on clear liquids for now. We'll continue her antihypertensives. The source of her bleeding is likely upper GI due to the dark colored stools. We'll defer further upper endoscopy/colonoscopy to the gastroenterology service. Her potassium will be repleted. Further orders per the clinical course.  Time Spent on Admission:  Makana Rostad [facility]  Pager: [phone] 06/19/2011, 3:33 PM

## 2011-06-19 NOTE — ED Notes (Signed)
Report given to Abby, RN unit 300. Ready to receive patient.  

## 2011-06-20 ENCOUNTER — Encounter (HOSPITAL_COMMUNITY): Payer: Self-pay | Admitting: *Deleted

## 2011-06-20 ENCOUNTER — Encounter (HOSPITAL_COMMUNITY)
Admission: EM | Disposition: A | Discharge status: Home / Self Care | Payer: Self-pay | Source: Home / Self Care | Attending: Internal Medicine

## 2011-06-20 DIAGNOSIS — D509 Iron deficiency anemia, unspecified: Secondary | ICD-10-CM

## 2011-06-20 DIAGNOSIS — K219 Gastro-esophageal reflux disease without esophagitis: Secondary | ICD-10-CM

## 2011-06-20 DIAGNOSIS — D131 Benign neoplasm of stomach: Secondary | ICD-10-CM

## 2011-06-20 DIAGNOSIS — K922 Gastrointestinal hemorrhage, unspecified: Secondary | ICD-10-CM

## 2011-06-20 DIAGNOSIS — K279 Peptic ulcer, site unspecified, unspecified as acute or chronic, without hemorrhage or perforation: Secondary | ICD-10-CM

## 2011-06-20 HISTORY — PX: ESOPHAGOGASTRODUODENOSCOPY: SHX5428

## 2011-06-20 LAB — COMPREHENSIVE METABOLIC PANEL
ALT: 6 U/L (ref 0–35)
AST: 15 U/L (ref 0–37)
CO2: 26 mEq/L (ref 19–32)
Chloride: 111 mEq/L (ref 96–112)
GFR calc non Af Amer: 64 mL/min — ABNORMAL LOW (ref >90)
Sodium: 144 mEq/L (ref 135–145)
Total Bilirubin: 0.4 mg/dL (ref 0.3–1.2)

## 2011-06-20 SURGERY — EGD (ESOPHAGOGASTRODUODENOSCOPY)
Anesthesia: Moderate Sedation

## 2011-06-20 MED ORDER — MIDAZOLAM HCL 5 MG/5ML IJ SOLN
INTRAMUSCULAR | Status: AC
  Filled 2011-06-20: qty 10

## 2011-06-20 MED ORDER — PEG 3350-KCL-NABCB-NACL-NASULF 236 G PO SOLR
4000.0000 mL | Freq: Once | ORAL | Status: AC
Start: 2011-06-21 — End: 2011-06-21
  Administered 2011-06-21: 4000 mL via ORAL
  Filled 2011-06-20: qty 4000

## 2011-06-20 MED ORDER — PEG 3350-KCL-NA BICARB-NACL 420 G PO SOLR
ORAL | Status: AC
  Filled 2011-06-20: qty 4000

## 2011-06-20 MED ORDER — MIDAZOLAM HCL 5 MG/5ML IJ SOLN
INTRAMUSCULAR | Status: DC | PRN
  Administered 2011-06-20 (×2): 2 mg via INTRAVENOUS

## 2011-06-20 MED ORDER — BUTAMBEN-TETRACAINE-BENZOCAINE 2-2-14 % EX AERO
INHALATION_SPRAY | CUTANEOUS | Status: DC | PRN
  Administered 2011-06-20: 2 via TOPICAL

## 2011-06-20 MED ORDER — SODIUM CHLORIDE 0.9 % IJ SOLN
INTRAMUSCULAR | Status: AC
  Filled 2011-06-20: qty 3

## 2011-06-20 MED ORDER — MEPERIDINE HCL 50 MG/ML IJ SOLN
INTRAMUSCULAR | Status: AC
  Filled 2011-06-20: qty 1

## 2011-06-20 MED ORDER — STERILE WATER FOR IRRIGATION IR SOLN
Status: DC | PRN
  Administered 2011-06-20: 13:00:00

## 2011-06-20 MED ORDER — SODIUM CHLORIDE 0.45 % IV SOLN
Freq: Once | INTRAVENOUS | Status: AC
  Administered 2011-06-20: 13:00:00 via INTRAVENOUS

## 2011-06-20 MED ORDER — SODIUM CHLORIDE 0.9 % IJ SOLN
INTRAMUSCULAR | Status: AC
  Administered 2011-06-20: 10 mL
  Filled 2011-06-20: qty 3

## 2011-06-20 MED ORDER — MEPERIDINE HCL 25 MG/ML IJ SOLN
INTRAMUSCULAR | Status: DC | PRN
  Administered 2011-06-20: 25 mg via INTRAVENOUS

## 2011-06-20 NOTE — Progress Notes (Signed)
Subjective: No further bleeding since admission, no new complaints  Objective: Vital signs in last 24 hours: Temp:  [97.7 F (36.5 C)-98.8 F (37.1 C)] 98.8 F (37.1 C) (04/02 0544) Pulse Rate:  [60-82] 65  (04/02 0544) Resp:  [13-20] 18  (04/02 0544) BP: (149-187)/(53-74) 187/71 mmHg (04/02 0544) SpO2:  [94 %-100 %] 99 % (04/02 0544) Weight:  [81.647 kg (180 lb)] 81.647 kg (180 lb) (04/01 1624) Weight change:  Last BM Date: 06/18/11  Intake/Output from previous day: 04/01 0701 - 04/02 0700 In: 610 [P.O.:610] Out: -      Physical Exam: General: Alert, awake, oriented x3, in no acute distress. HEENT: No bruits, no goiter. Heart: Regular rate and rhythm, without murmurs, rubs, gallops. Lungs: Clear to auscultation bilaterally. Abdomen: Soft, nontender, nondistended, positive bowel sounds. Extremities: No clubbing cyanosis or edema with positive pedal pulses. Neuro: Grossly intact, nonfocal.    Lab Results: Basic Metabolic Panel:  Basename 06/20/11 0519 06/19/11 1150  NA 144 141  K 3.7 3.4*  CL 111 105  CO2 26 25  GLUCOSE 85 95  BUN 13 20  CREATININE 0.85 0.86  CALCIUM 9.4 10.1  MG -- --  PHOS -- --   Liver Function Tests:  Basename 06/20/11 0519 06/19/11 1150  AST 15 18  ALT 6 10  ALKPHOS 62 79  BILITOT 0.4 0.4  PROT 5.9* 7.7  ALBUMIN 3.0* 4.0   No results found for this basename: LIPASE:2,AMYLASE:2 in the last 72 hours No results found for this basename: AMMONIA:2 in the last 72 hours CBC:  Basename 06/19/11 2001 06/19/11 1150  WBC 7.2 8.5  NEUTROABS -- --  HGB 9.8* 10.9*  HCT 29.0* 33.3*  MCV 87.1 87.6  PLT 224 271   Cardiac Enzymes: No results found for this basename: CKTOTAL:3,CKMB:3,CKMBINDEX:3,TROPONINI:3 in the last 72 hours BNP: No results found for this basename: PROBNP:3 in the last 72 hours D-Dimer: No results found for this basename: DDIMER:2 in the last 72 hours CBG: No results found for this basename: GLUCAP:6 in the last 72  hours Hemoglobin A1C: No results found for this basename: HGBA1C in the last 72 hours Fasting Lipid Panel: No results found for this basename: CHOL,HDL,LDLCALC,TRIG,CHOLHDL,LDLDIRECT in the last 72 hours Thyroid Function Tests: No results found for this basename: TSH,T4TOTAL,FREET4,T3FREE,THYROIDAB in the last 72 hours Anemia Panel: No results found for this basename: VITAMINB12,FOLATE,FERRITIN,TIBC,IRON,RETICCTPCT in the last 72 hours Coagulation:  Basename 06/19/11 1150  LABPROT 13.4  INR 1.00   Urine Drug Screen: Drugs of Abuse  No results found for this basename: labopia, cocainscrnur, labbenz, amphetmu, thcu, labbarb    Alcohol Level: No results found for this basename: ETH:2 in the last 72 hours Urinalysis: No results found for this basename: COLORURINE:2,APPERANCEUR:2,LABSPEC:2,PHURINE:2,GLUCOSEU:2,HGBUR:2,BILIRUBINUR:2,KETONESUR:2,PROTEINUR:2,UROBILINOGEN:2,NITRITE:2,LEUKOCYTESUR:2 in the last 72 hours  No results found for this or any previous visit (from the past 240 hour(s)).  Studies/Results: No results found.  Medications: Scheduled Meds:   . amLODipine  5 mg Oral Daily  . captopril  75 mg Oral Daily  . losartan  100 mg Oral Daily   And  . hydrochlorothiazide  25 mg Oral Daily  . pantoprazole      . pantoprazole (PROTONIX) IV  80 mg Intravenous Once  . pantoprazole (PROTONIX) IV  40 mg Intravenous Q12H  . potassium chloride  40 mEq Oral Once  . sodium chloride  3 mL Intravenous Q12H  . sodium chloride      . DISCONTD: sodium chloride   Intravenous STAT  . DISCONTD: losartan-hydrochlorothiazide  1 tablet Oral Daily   Continuous Infusions:   . sodium chloride 50 mL/hr at 06/19/11 1702   PRN Meds:.acetaminophen, acetaminophen, ondansetron (ZOFRAN) IV, ondansetron  Assessment/Plan:  Principal Problem:  *GI bleed Active Problems:  HYPERTENSION  Anemia  Obesity  Hypokalemia  Plan:  No further signs of bleeding Hemoglobin has remained stable, no  need for transfusion at present Dr. Karilyn Cota will see patient today Continue protonix   LOS: 1 day   [facility] Pager: [phone] 06/20/2011, 9:01 AM

## 2011-06-20 NOTE — Progress Notes (Signed)
CARE MANAGEMENT NOTE 06/20/2011  Patient:  Vicki Woods, Vicki Woods   Account Number:  [id number]  Date Initiated:  06/20/2011  Documentation initiated by:  Rosemary Holms  Subjective/Objective Assessment:   Pt admitted with GI bleed. PTA lived independently at home.     Action/Plan:   pt does not anticipate any DC needs.   Anticipated DC Date:  06/22/2011   Anticipated DC Plan:  HOME/SELF CARE      DC Planning Services  CM consult      Choice offered to / List presented to:             Status of service:  In process, will continue to follow Medicare Important Message given?   (If response is "NO", the following Medicare IM given date fields will be blank) Date Medicare IM given:   Date Additional Medicare IM given:    Discharge Disposition:    Per UR Regulation:    If discussed at Long Length of Stay Meetings, dates discussed:    Comments:  06/20/11 [address] Leanord Hawking RN BSN CM

## 2011-06-20 NOTE — Consult Note (Signed)
Reason for Consult: GI bleed Referring Physician Vicki Woods : Vicki Woods is an 78 y.o. female.  HPI: Admitted thru the ED yesterday with rectal bleeding. Rectal bleeding started Thursday.  She had a BM Friday and saw blood in her stool.   Saturday she also had a bloody stool.  She describes the blood as marooned colored. She denies prior hx of rectal bleeding. She was guaiac positive in the ED.  There was no pain associated with her BMs.  No abdominal pain. No nausea or acid reflux.  She saw Dr. Karilyn Cota four yrs ago for weight loss.She declined a colonoscopy at that time. Her weight loss probably was due to her husband's death. She had lost approximately  60 pounds. Noted on admission hemoglobin was 9.8.  Past Medical History  Diagnosis Date  . Hypertension   . GERD (gastroesophageal reflux disease)   . Proteinuria     Past Surgical History  Procedure Date  . Total abdominal hysterectomy w/ bilateral salpingoophorectomy     Family History  Problem Relation Age of Onset  . Ovarian cancer Mother   . Lung cancer Brother     Social History:  reports that she has never smoked. She has never used smokeless tobacco. She reports that she does not drink alcohol or use illicit drugs.  Allergies:  Allergies  Allergen Reactions  . Chocolate Shortness Of Breath and Swelling  . Honey Shortness Of Breath  . Aspirin   . Codeine   . Penicillins   . Sulfonamide Derivatives     Medications: I have reviewed the patient's current medications.  Results for orders placed during the hospital encounter of 06/19/11 (from the past 48 hour(s))  PROTIME-INR     Status: Normal   Collection Time   06/19/11 11:50 AM      Component Value Range Comment   Prothrombin Time 13.4  11.6 - 15.2 (seconds)    INR 1.00  0.00 - 1.49    CBC     Status: Abnormal   Collection Time   06/19/11 11:50 AM      Component Value Range Comment   WBC 8.5  4.0 - 10.5 (K/uL)    RBC 3.80 (*) 3.87 - 5.11 (MIL/uL)    Hemoglobin  10.9 (*) 12.0 - 15.0 (g/dL)    HCT 44.0 (*) 10.2 - 46.0 (%)    MCV 87.6  78.0 - 100.0 (fL)    MCH 28.7  26.0 - 34.0 (pg)    MCHC 32.7  30.0 - 36.0 (g/dL)    RDW 72.5 (*) 36.6 - 15.5 (%)    Platelets 271  150 - 400 (K/uL)   COMPREHENSIVE METABOLIC PANEL     Status: Abnormal   Collection Time   06/19/11 11:50 AM      Component Value Range Comment   Sodium 141  135 - 145 (mEq/L)    Potassium 3.4 (*) 3.5 - 5.1 (mEq/L)    Chloride 105  96 - 112 (mEq/L)    CO2 25  19 - 32 (mEq/L)    Glucose, Bld 95  70 - 99 (mg/dL)    BUN 20  6 - 23 (mg/dL)    Creatinine, Ser 4.40  0.50 - 1.10 (mg/dL)    Calcium 34.7  8.4 - 10.5 (mg/dL)    Total Protein 7.7  6.0 - 8.3 (g/dL)    Albumin 4.0  3.5 - 5.2 (g/dL)    AST 18  0 - 37 (U/L)    ALT 10  0 - 35 (U/L)    Alkaline Phosphatase 79  39 - 117 (U/L)    Total Bilirubin 0.4  0.3 - 1.2 (mg/dL)    GFR calc non Af Amer 64 (*) >90 (mL/min)    GFR calc Af Amer 74 (*) >90 (mL/min)   OCCULT BLOOD, POC DEVICE     Status: Normal   Collection Time   06/19/11 11:59 AM      Component Value Range Comment   Fecal Occult Bld POSITIVE     TYPE AND SCREEN     Status: Normal   Collection Time   06/19/11 12:25 PM      Component Value Range Comment   ABO/RH(D) O POS      Antibody Screen POS      Sample Expiration 06/22/2011      Antibody Identification ANTI-K ANTI-Fya (DUFFY a)      DAT, IgG NEG      PT AG Type        Value: NEGATIVE FOR KELL ANTIGEN NEGATIVE FOR DUFFY A ANTIGEN  CBC     Status: Abnormal   Collection Time   06/19/11  8:01 PM      Component Value Range Comment   WBC 7.2  4.0 - 10.5 (K/uL)    RBC 3.33 (*) 3.87 - 5.11 (MIL/uL)    Hemoglobin 9.8 (*) 12.0 - 15.0 (g/dL)    HCT 28.4 (*) 13.2 - 46.0 (%)    MCV 87.1  78.0 - 100.0 (fL)    MCH 29.4  26.0 - 34.0 (pg)    MCHC 33.8  30.0 - 36.0 (g/dL)    RDW 44.0 (*) 10.2 - 15.5 (%)    Platelets 224  150 - 400 (K/uL)   COMPREHENSIVE METABOLIC PANEL     Status: Abnormal   Collection Time   06/20/11  5:19 AM       Component Value Range Comment   Sodium 144  135 - 145 (mEq/L)    Potassium 3.7  3.5 - 5.1 (mEq/L)    Chloride 111  96 - 112 (mEq/L)    CO2 26  19 - 32 (mEq/L)    Glucose, Bld 85  70 - 99 (mg/dL)    BUN 13  6 - 23 (mg/dL)    Creatinine, Ser 7.25  0.50 - 1.10 (mg/dL)    Calcium 9.4  8.4 - 10.5 (mg/dL)    Total Protein 5.9 (*) 6.0 - 8.3 (g/dL)    Albumin 3.0 (*) 3.5 - 5.2 (g/dL)    AST 15  0 - 37 (U/L)    ALT 6  0 - 35 (U/L)    Alkaline Phosphatase 62  39 - 117 (U/L)    Total Bilirubin 0.4  0.3 - 1.2 (mg/dL)    GFR calc non Af Amer 64 (*) >90 (mL/min)    GFR calc Af Amer 75 (*) >90 (mL/min)     No results found.  ROS Blood pressure 187/71, pulse 65, temperature 98.8 F (37.1 C), temperature source Oral, resp. rate 18, height 5' (1.524 m), weight 180 lb (81.647 kg), SpO2 99.00%. Physical ExamAlert and oriented. Skin warm and dry. Oral mucosa is moist.   . Sclera anicteric, conjunctivae is pink. Thyroid not enlarged. No cervical lymphadenopathy. Lungs clear. Heart regular rate and rhythm.  Abdomen is soft. Bowel sounds are positive. No hepatomegaly. No abdominal masses felt. No tenderness.  No edema to lower extremities. Patient is alert and oriented.   Assessment/Plan: GI bleed. PUD disease needs to  be ruled. Gastric AVM needs to be ruled out as well as gastric carcinoma. EGD today. If normal Dr. Karilyn Cota will proceed with a colonoscopy tomorrow.  SETZER,TERRI W 06/20/2011, 8:43 AM    GI attending note. Patient interviewed and data reviewed. Will examine her when she has finished her liquid breakfast. Patient takes Tylenol substitute and remains to be seen whether this is an NSAID. Need to rule out GI bleed from upper source before colonoscopy considered. Patient is agreeable.

## 2011-06-20 NOTE — Progress Notes (Signed)
UR Chart Review Completed  

## 2011-06-20 NOTE — Op Note (Signed)
EGD PROCEDURE REPORT  PATIENT:  Vicki Woods  MR#:  [id number] Birthdate:  [birth year], 78 y.o., female Endoscopist:  Dr. Malissa Hippo, MD Referred By:  Dr. Erick Blinks, MD Procedure Date: 06/20/2011  Procedure:   EGD.  Indications:  Patient is 78 year old African female who presents with acute GI bleed and anemia. She is undergoing diagnostic EGD.            Informed Consent:  The risks, benefits, alternatives & imponderables which include, but are not limited to, bleeding, infection, perforation, drug reaction and potential missed lesion have been reviewed.  The potential for biopsy, lesion removal, esophageal dilation, etc. have also been discussed.  Questions have been answered.  All parties agreeable.  Please see history & physical in medical record for more information.  Medications:  Demerol 25 mg IV Versed 3 mg IV Cetacaine spray topically for oropharyngeal anesthesia  Description of procedure:  The endoscope was introduced through the mouth and advanced to the second portion of the duodenum without difficulty or limitations. The mucosal surfaces were surveyed very carefully during advancement of the scope and upon withdrawal.  Findings:  Esophagus:  Somewhat dilated body of the esophagus but there was no food debris. Mucosa however was normal. Mucosal edema at GE junction. GEJ:  35 cm Hiatus:  37cm Stomach:  Stomach was empty and distended very well with insufflation. Folds in the proximal stomach were normal. There were 3 small hyperplastic-appearing polyps the gastric body which were left alone. Single erosion noted at proximal gastric body without stigmata of bleeding. Erythema to mucosa and pyloric channel but no ulcer or stricture noted. There is fundus and cardia with examined by retroflexing the scope and were normal. Duodenum:  Normal bulbar and post bulbar mucosa.  Therapeutic/Diagnostic Maneuvers Performed:  None  Complications:  None  Impression: Small  sliding-type hernia with mild changes of reflux esophagitis limited to GE junction. Few small hyperplastic-appearing polyps the gastric body which were left alone. Single erosion and gastric body and pyloric channel inflammation but no stigmata of bleed.  Recommendations:  H. pylori serology. Colonoscopy in a.m..  Royer Cristobal U  06/20/2011  1:54 PM  CC: Dr. Colette Ribas, MD, MD & Dr. Bonnetta Barry ref. provider found

## 2011-06-21 ENCOUNTER — Encounter (HOSPITAL_COMMUNITY): Payer: Self-pay | Admitting: *Deleted

## 2011-06-21 ENCOUNTER — Encounter (HOSPITAL_COMMUNITY)
Admission: EM | Disposition: A | Discharge status: Home / Self Care | Payer: Self-pay | Source: Home / Self Care | Attending: Internal Medicine

## 2011-06-21 DIAGNOSIS — D128 Benign neoplasm of rectum: Secondary | ICD-10-CM

## 2011-06-21 DIAGNOSIS — K5730 Diverticulosis of large intestine without perforation or abscess without bleeding: Secondary | ICD-10-CM

## 2011-06-21 DIAGNOSIS — D126 Benign neoplasm of colon, unspecified: Secondary | ICD-10-CM

## 2011-06-21 DIAGNOSIS — D129 Benign neoplasm of anus and anal canal: Secondary | ICD-10-CM

## 2011-06-21 HISTORY — PX: COLONOSCOPY: SHX5424

## 2011-06-21 LAB — CBC
HCT: 27.4 % — ABNORMAL LOW (ref 36.0–46.0)
MCV: 87.8 fL (ref 78.0–100.0)
RBC: 3.12 MIL/uL — ABNORMAL LOW (ref 3.87–5.11)
WBC: 6.1 10*3/uL (ref 4.0–10.5)

## 2011-06-21 SURGERY — COLONOSCOPY
Anesthesia: Moderate Sedation

## 2011-06-21 MED ORDER — MEPERIDINE HCL 50 MG/ML IJ SOLN
INTRAMUSCULAR | Status: DC | PRN
  Administered 2011-06-21: 25 mg via INTRAVENOUS

## 2011-06-21 MED ORDER — MIDAZOLAM HCL 5 MG/5ML IJ SOLN
INTRAMUSCULAR | Status: AC
  Filled 2011-06-21: qty 10

## 2011-06-21 MED ORDER — MIDAZOLAM HCL 5 MG/5ML IJ SOLN
INTRAMUSCULAR | Status: DC | PRN
  Administered 2011-06-21: 2 mg via INTRAVENOUS
  Administered 2011-06-21 (×2): 1 mg via INTRAVENOUS

## 2011-06-21 MED ORDER — SODIUM CHLORIDE 0.9 % IJ SOLN
INTRAMUSCULAR | Status: AC
  Administered 2011-06-21: 10 mL
  Filled 2011-06-21: qty 3

## 2011-06-21 MED ORDER — HYDRALAZINE HCL 25 MG PO TABS
50.0000 mg | ORAL_TABLET | Freq: Three times a day (TID) | ORAL | Status: DC
  Administered 2011-06-21 – 2011-06-23 (×6): 50 mg via ORAL
  Filled 2011-06-21 (×7): qty 2

## 2011-06-21 MED ORDER — SODIUM CHLORIDE 0.9 % IJ SOLN
INTRAMUSCULAR | Status: AC
  Filled 2011-06-21: qty 10

## 2011-06-21 MED ORDER — MEPERIDINE HCL 50 MG/ML IJ SOLN
INTRAMUSCULAR | Status: AC
  Filled 2011-06-21: qty 1

## 2011-06-21 MED ORDER — STERILE WATER FOR IRRIGATION IR SOLN
Status: DC | PRN
  Administered 2011-06-21: 16:00:00

## 2011-06-21 NOTE — Op Note (Signed)
COLONOSCOPY PROCEDURE REPORT  PATIENT:  CORABELLE SPACKMAN  MR#:  [id number] Birthdate:  [birth year], 78 y.o., female Endoscopist:  Dr. Malissa Hippo, MD Referred By:  Dr. Erick Blinks, MD Procedure Date: 06/21/2011  Procedure:   Colonoscopy with snare polypectomy.  Indications:  Patient is a 78 year old African female with acute GI bleed and anemia. She underwent EGD yesterday and now lesion was found to account for her bleeding. She is therefore undergoing colonoscopy.  Informed Consent:  The procedure and risks were reviewed with the patient and informed consent was obtained.  Medications:  Demerol 25 mg IV Versed 4 mg IV  Description of procedure:  After a digital rectal exam was performed, that colonoscope was advanced from the anus through the rectum and colon to the area of the cecum, ileocecal valve and appendiceal orifice. The cecum was deeply intubated. These structures were well-seen and photographed for the record. From the level of the cecum and ileocecal valve, the scope was slowly and cautiously withdrawn. The mucosal surfaces were carefully surveyed utilizing scope tip to flexion to facilitate fold flattening as needed. The scope was pulled down into the rectum where a thorough exam including retroflexion was performed.  Findings:   Prep excellent. Small polyp at cecum and 2 medium to large polyps at the ascending colon. Large complex polyp at hepatic flexure with polypoidal and a flat segment. At segment very friable and suspicious for carcinoma. Multiple biopsies taken. Another large sessile polyp at proximal transverse colon about 25 mm long. This polyp was biopsied for histology. 5 mm polyp ablated via cold biopsy from splenic flexure. 5 mm ulcerated polyp snared from the rectum. 2 scattered diverticula at sigmoid and descending colon.   Therapeutic/Diagnostic Maneuvers Performed:  See above  Complications:  None  Cecal Withdrawal Time:  20 minutes  Impression:   Examination performed to cecum. Multiple polyps located at ascending, hepatic flexure and proximal transverse colon. Of these the largest polyp was at hepatic flexure the appearance suspicious for malignant change. This lesion was biopsied. Another flat polyp from proximal transverse colon was biopsied for histology. Moderate polyp at splenic flexure ablated via cold biopsy. Small ulcerated rectal polyp snared.  Recommendations:  Full liquid diet. CEA. Abdominopelvic CT with contrast. Surgical consultation.  Aliceson Dolbow U  06/21/2011 4:46 PM  CC: Dr. Colette Ribas, MD, MD & Dr. Bonnetta Barry ref. provider found

## 2011-06-21 NOTE — Progress Notes (Signed)
Patient ID: Vicki Woods, female   DOB: 1932/02/18, 78 y.o.   MRN: [id number] Remains alert. She tells me that she has not seen any further rectal bleeding. H and H have dropped slightly. Presently taking Golytely for colonoscopy. Filed Vitals:   06/20/11 1404 06/20/11 1647 06/20/11 2207 06/21/11 0556  BP: 164/68  132/56 152/80  Pulse: 76  58 63  Temp: 97.5 F (36.4 C)  98.2 F (36.8 C) 98.2 F (36.8 C)  TempSrc: Oral  Oral Oral  Resp: 18  20 20   Height:      Weight:      SpO2: 98% 98% 99% 100%    CBC    Component Value Date/Time   WBC 6.1 06/21/2011 0503   RBC 3.12* 06/21/2011 0503   HGB 9.0* 06/21/2011 0503   HCT 27.4* 06/21/2011 0503   PLT 222 06/21/2011 0503   MCV 87.8 06/21/2011 0503   MCH 28.8 06/21/2011 0503   MCHC 32.8 06/21/2011 0503   RDW 16.3* 06/21/2011 0503   Plan: Colonoscopy later today with Dr. Karilyn Cota.

## 2011-06-21 NOTE — Progress Notes (Signed)
Subjective: Patient has not had any further rectal bleeding, no abdominal pain or vomiting  Objective: Vital signs in last 24 hours: Temp:  [97.5 F (36.4 C)-98.2 F (36.8 C)] 98.2 F (36.8 C) (04/03 0556) Pulse Rate:  [58-89] 63  (04/03 0556) Resp:  [14-20] 20  (04/03 0556) BP: (132-171)/(55-86) 171/74 mmHg (04/03 1220) SpO2:  [98 %-100 %] 100 % (04/03 0556) Weight change:  Last BM Date: 06/21/11  Intake/Output from previous day: 04/02 0701 - 04/03 0700 In: 480 [P.O.:480] Out: -      Physical Exam: General: Alert, awake, oriented x3, in no acute distress. HEENT: No bruits, no goiter. Heart: Regular rate and rhythm, without murmurs, rubs, gallops. Lungs: Clear to auscultation bilaterally. Abdomen: Soft, nontender, nondistended, positive bowel sounds. Extremities: No clubbing cyanosis or edema with positive pedal pulses. Neuro: Grossly intact, nonfocal.    Lab Results: Basic Metabolic Panel:  Basename 06/20/11 0519 06/19/11 1150  NA 144 141  K 3.7 3.4*  CL 111 105  CO2 26 25  GLUCOSE 85 95  BUN 13 20  CREATININE 0.85 0.86  CALCIUM 9.4 10.1  MG -- --  PHOS -- --   Liver Function Tests:  Basename 06/20/11 0519 06/19/11 1150  AST 15 18  ALT 6 10  ALKPHOS 62 79  BILITOT 0.4 0.4  PROT 5.9* 7.7  ALBUMIN 3.0* 4.0   No results found for this basename: LIPASE:2,AMYLASE:2 in the last 72 hours No results found for this basename: AMMONIA:2 in the last 72 hours CBC:  Basename 06/21/11 0503 06/19/11 2001  WBC 6.1 7.2  NEUTROABS -- --  HGB 9.0* 9.8*  HCT 27.4* 29.0*  MCV 87.8 87.1  PLT 222 224   Cardiac Enzymes: No results found for this basename: CKTOTAL:3,CKMB:3,CKMBINDEX:3,TROPONINI:3 in the last 72 hours BNP: No results found for this basename: PROBNP:3 in the last 72 hours D-Dimer: No results found for this basename: DDIMER:2 in the last 72 hours CBG: No results found for this basename: GLUCAP:6 in the last 72 hours Hemoglobin A1C: No results found  for this basename: HGBA1C in the last 72 hours Fasting Lipid Panel: No results found for this basename: CHOL,HDL,LDLCALC,TRIG,CHOLHDL,LDLDIRECT in the last 72 hours Thyroid Function Tests: No results found for this basename: TSH,T4TOTAL,FREET4,T3FREE,THYROIDAB in the last 72 hours Anemia Panel: No results found for this basename: VITAMINB12,FOLATE,FERRITIN,TIBC,IRON,RETICCTPCT in the last 72 hours Coagulation:  Basename 06/19/11 1150  LABPROT 13.4  INR 1.00   Urine Drug Screen: Drugs of Abuse  No results found for this basename: labopia, cocainscrnur, labbenz, amphetmu, thcu, labbarb    Alcohol Level: No results found for this basename: ETH:2 in the last 72 hours Urinalysis: No results found for this basename: COLORURINE:2,APPERANCEUR:2,LABSPEC:2,PHURINE:2,GLUCOSEU:2,HGBUR:2,BILIRUBINUR:2,KETONESUR:2,PROTEINUR:2,UROBILINOGEN:2,NITRITE:2,LEUKOCYTESUR:2 in the last 72 hours  No results found for this or any previous visit (from the past 240 hour(s)).  Studies/Results: No results found.  Medications: Scheduled Meds:   . sodium chloride   Intravenous Once  . amLODipine  5 mg Oral Daily  . captopril  75 mg Oral Daily  . losartan  100 mg Oral Daily   And  . hydrochlorothiazide  25 mg Oral Daily  . meperidine      . midazolam      . pantoprazole (PROTONIX) IV  40 mg Intravenous Q12H  . polyethylene glycol  4,000 mL Oral Once  . sodium chloride  3 mL Intravenous Q12H  . sodium chloride      . sodium chloride      . sodium chloride  Continuous Infusions:  PRN Meds:.acetaminophen, acetaminophen, ondansetron (ZOFRAN) IV, ondansetron, DISCONTD: butamben-tetracaine-benzocaine, DISCONTD: meperidine, DISCONTD: midazolam, DISCONTD: simethicone susp in sterile water 1000 mL irrigation  Assessment/Plan:  Principal Problem:  *GI bleed Active Problems:  HYPERTENSION  Anemia  Obesity  Hypokalemia  Plan:  Patient had endoscopy yesterday which was unrevealing.  She is  scheduled for colonoscopy later today.  Hemoglobin has had a slight decrease, but does not need transfusion at this point.  Add hydralazine for better blood pressure control.  No beta blocker since heart rate in 60s  Possibly home tomorrow if stable.   LOS: 2 days   Monetta Lick [facility] Pager: [phone] 06/21/2011, 12:34 PM

## 2011-06-22 ENCOUNTER — Inpatient Hospital Stay (HOSPITAL_COMMUNITY): Payer: Medicare Other

## 2011-06-22 LAB — CBC
HCT: 29.5 % — ABNORMAL LOW (ref 36.0–46.0)
Hemoglobin: 9.8 g/dL — ABNORMAL LOW (ref 12.0–15.0)
RBC: 3.38 MIL/uL — ABNORMAL LOW (ref 3.87–5.11)
WBC: 7 10*3/uL (ref 4.0–10.5)

## 2011-06-22 LAB — CEA: CEA: 1.2 ng/mL (ref 0.0–5.0)

## 2011-06-22 MED ORDER — IOHEXOL 300 MG/ML  SOLN
100.0000 mL | Freq: Once | INTRAMUSCULAR | Status: AC | PRN
  Administered 2011-06-22: 100 mL via INTRAVENOUS

## 2011-06-22 MED ORDER — SODIUM CHLORIDE 0.9 % IJ SOLN
INTRAMUSCULAR | Status: AC
  Administered 2011-06-22: 10 mL
  Filled 2011-06-22: qty 3

## 2011-06-22 NOTE — Progress Notes (Signed)
Patient has no complaints. She denies abdominal pain nausea vomiting or rectal bleeding. Lab data; H&H from this morning was 9.8 and 29.7. CEA is 1.2 H. pylori serology is negative. Biopsy of lesion at hepatic flexure meals adenocarcinoma. Biopsy of polyp at proximal transverse colon reveals tubular adenoma with foci of high grade dysplasia. Abdominal pelvic CT reviewed with Dr. Tyron Russell. Mass noted at hepatic flexure corresponding to endoscopic findings. Bilateral adrenal enlargement possibly benign. 2 small lesions in the liver possibly cysts but too small to characterize. Small amount of fluid in Morrison's pouch of unknown etiology. Assessment; Patient has adenocarcinoma of colon at hepatic flexure most likely arising from the adenoma. Second polyp at proximal transverse colon reveals adenoma with high-grade dysplasia. Patient will need a right hemicolectomy. She has multiple other polyps in this segment as well. Patient is not sure if she wants to have surgery here or in [place] but she would like to see Dr. Lovell Sheehan. Dr. Lovell Sheehan is of town therefore will ask Dr. Leticia Penna to see patient in am.

## 2011-06-22 NOTE — Progress Notes (Signed)
Subjective: Patient denies any further bleeding, no abdominal pain  Objective: Vital signs in last 24 hours: Temp:  [96.6 F (35.9 C)-98.4 F (36.9 C)] 98.4 F (36.9 C) (04/04 0536) Pulse Rate:  [56-87] 76  (04/04 0536) Resp:  [8-20] 20  (04/04 0536) BP: (100-172)/(47-83) 149/65 mmHg (04/04 0536) SpO2:  [97 %-100 %] 100 % (04/04 0536) Weight change:  Last BM Date: 06/21/11  Intake/Output from previous day: 04/03 0701 - 04/04 0700 In: 610 [P.O.:360; I.V.:250] Out: 450 [Urine:450]     Physical Exam: General: Alert, awake, oriented x3, in no acute distress. HEENT: No bruits, no goiter. Heart: Regular rate and rhythm, without murmurs, rubs, gallops. Lungs: Clear to auscultation bilaterally. Abdomen: Soft, nontender, nondistended, positive bowel sounds. Extremities: No clubbing cyanosis or edema with positive pedal pulses. Neuro: Grossly intact, nonfocal.    Lab Results: Basic Metabolic Panel:  Basename 06/20/11 0519 06/19/11 1150  NA 144 141  K 3.7 3.4*  CL 111 105  CO2 26 25  GLUCOSE 85 95  BUN 13 20  CREATININE 0.85 0.86  CALCIUM 9.4 10.1  MG -- --  PHOS -- --   Liver Function Tests:  Basename 06/20/11 0519 06/19/11 1150  AST 15 18  ALT 6 10  ALKPHOS 62 79  BILITOT 0.4 0.4  PROT 5.9* 7.7  ALBUMIN 3.0* 4.0   No results found for this basename: LIPASE:2,AMYLASE:2 in the last 72 hours No results found for this basename: AMMONIA:2 in the last 72 hours CBC:  Basename 06/22/11 0521 06/21/11 0503  WBC 7.0 6.1  NEUTROABS -- --  HGB 9.8* 9.0*  HCT 29.5* 27.4*  MCV 87.3 87.8  PLT 216 222   Cardiac Enzymes: No results found for this basename: CKTOTAL:3,CKMB:3,CKMBINDEX:3,TROPONINI:3 in the last 72 hours BNP: No results found for this basename: PROBNP:3 in the last 72 hours D-Dimer: No results found for this basename: DDIMER:2 in the last 72 hours CBG: No results found for this basename: GLUCAP:6 in the last 72 hours Hemoglobin A1C: No results found  for this basename: HGBA1C in the last 72 hours Fasting Lipid Panel: No results found for this basename: CHOL,HDL,LDLCALC,TRIG,CHOLHDL,LDLDIRECT in the last 72 hours Thyroid Function Tests: No results found for this basename: TSH,T4TOTAL,FREET4,T3FREE,THYROIDAB in the last 72 hours Anemia Panel: No results found for this basename: VITAMINB12,FOLATE,FERRITIN,TIBC,IRON,RETICCTPCT in the last 72 hours Coagulation:  Basename 06/19/11 1150  LABPROT 13.4  INR 1.00   Urine Drug Screen: Drugs of Abuse  No results found for this basename: labopia, cocainscrnur, labbenz, amphetmu, thcu, labbarb    Alcohol Level: No results found for this basename: ETH:2 in the last 72 hours Urinalysis: No results found for this basename: COLORURINE:2,APPERANCEUR:2,LABSPEC:2,PHURINE:2,GLUCOSEU:2,HGBUR:2,BILIRUBINUR:2,KETONESUR:2,PROTEINUR:2,UROBILINOGEN:2,NITRITE:2,LEUKOCYTESUR:2 in the last 72 hours  No results found for this or any previous visit (from the past 240 hour(s)).  Studies/Results: No results found.  Medications: Scheduled Meds:   . amLODipine  5 mg Oral Daily  . captopril  75 mg Oral Daily  . hydrALAZINE  50 mg Oral Q8H  . losartan  100 mg Oral Daily   And  . hydrochlorothiazide  25 mg Oral Daily  . meperidine      . midazolam      . pantoprazole (PROTONIX) IV  40 mg Intravenous Q12H  . sodium chloride  3 mL Intravenous Q12H  . sodium chloride      . sodium chloride       Continuous Infusions:  PRN Meds:.acetaminophen, acetaminophen, ondansetron (ZOFRAN) IV, ondansetron, DISCONTD: meperidine, DISCONTD: midazolam, DISCONTD: simethicone susp in  sterile water 1000 mL irrigation  Assessment/Plan:  Principal Problem:  *GI bleed Active Problems:  HYPERTENSION  Anemia  Obesity  Hypokalemia  Plan:  Patient had colonoscopy done yesterday which showed multiple polyps in the colon, some concerning for malignancy, which was likely the source of bleeding.  Biopsies taken.  Patient for  CT abd/pelvis today. Will order surgical consult per GI recommendations.  Patient is not sure whether she wishes to have surgery done at this facility. She would like to discuss this with the surgical service.  Her hemoglobin remains stable, does not need to be transfused, and no recurrence of bleeding  Blood pressure is better controlled today.   LOS: 3 days   Lakeva Hollon [facility] Pager: [phone] 06/22/2011, 10:14 AM

## 2011-06-23 ENCOUNTER — Encounter (HOSPITAL_COMMUNITY): Payer: Self-pay | Admitting: Internal Medicine

## 2011-06-23 LAB — HEMOGLOBIN AND HEMATOCRIT, BLOOD
HCT: 29.6 % — ABNORMAL LOW (ref 36.0–46.0)
Hemoglobin: 9.8 g/dL — ABNORMAL LOW (ref 12.0–15.0)

## 2011-06-23 MED ORDER — HYDRALAZINE HCL 25 MG PO TABS: 25.0000 mg | ORAL_TABLET | Freq: Three times a day (TID) | ORAL | Status: DC

## 2011-06-23 MED ORDER — SODIUM CHLORIDE 0.9 % IJ SOLN
INTRAMUSCULAR | Status: AC
  Filled 2011-06-23: qty 3

## 2011-06-23 NOTE — Discharge Instructions (Signed)
Colorectal Cancer The colon is the large bowel and is the storage part of the bowel for undigested food. The large bowel is also called the intestine or gut. Cancer is a growth that is not supposed to be there.  RISK FACTORS Risks for cancer of the colon include:   Age. More than 90 percent of people with this disease are diagnosed after age 76.   Polyps. Growths on the inner wall of the colon or rectum may become cancerous.   Family history. Some cancers of the colon are inherited or run in families. These include:   Hereditary nonpolyposis colon cancer (HNPCC) is the most common type of inherited (genetic) colorectal cancer. It accounts for about 2 percent of all colorectal cancer cases. It is caused by genetic changes. About 75% of people with an altered HNPCC gene develop colon cancer, and the average age at diagnosis of colon cancer is 44.   Familial adenomatous polyposis (FAP) is a rare inherited condition in which hundreds of polyps form in the colon and rectum. It is caused by a change in a specific gene called APC. Unless FAP is treated, it usually leads to colorectal cancer by age 40. FAP accounts for less than 1 percent of all colorectal cancer cases.   Family members of people who have HNPCC or FAP can have genetic testing to check for specific genetic changes. For those who have changes in their genes, health care providers may suggest ways to try to reduce the risk of colorectal cancer or to improve the detection of this disease. For adults with FAP, the doctor may recommend an operation to remove all or part of the colon and rectum.   Personal history of colorectal cancer. A person who has already had colorectal cancer may develop colorectal cancer a second time. Also, women with a history of cancer of the ovary, uterus (endometrium), or breast are at a somewhat higher risk of developing colorectal cancer.   Ulcerative colitis or Crohn's disease. A person who has had a condition  that causes inflammation of the colon (such as ulcerative colitis or Crohn's disease) for many years is at increased risk of developing colorectal cancer.   Diet. Studies suggest that diets high in fat (especially animal fat) and low in calcium, folate, and fiber may increase the risk of colorectal cancer. Also, some studies suggest that people who eat a diet very low in fruits and vegetables may have a higher risk of colorectal cancer. More research is needed to better understand how diet affects the risk of colorectal cancer.   Cigarette smoking. A person who smokes cigarettes may be at increased risk of developing polyps and colorectal cancer.  SYMPTOMS  Changes in bowel habits.   Diarrhea, constipation, or feeling that the bowel does not empty completely.   Blood (either bright red or very dark) in the stool.   Stools that are narrower than usual.   General discomfort in your belly (abdomen): frequent gas pains, bloating, fullness, and/or cramps.   Weight loss with no known reason.   Constant tiredness.   Nausea and vomiting.  Other health problems can cause the same symptoms, and often these symptoms are not due to cancer. Anyone with these symptoms should see a doctor so that any problem can be diagnosed and treated as early as possible. Usually, early cancer does not cause pain. It is important not to wait to feel pain before seeing a doctor. DIAGNOSIS  If you have any signs or symptoms of   colorectal cancer, the doctor must determine whether they are due to cancer or some other cause. The doctor will ask about personal and family medical history and may do a physical exam. You may have one or more tests to screen for cancer. If the physical exam and test results do not suggest cancer, the doctor may decide that no further tests are needed and no treatment is necessary. Your caregiver may recommend a schedule for checkups. If X-rays show an abnormal area (such as a polyp), a piece of  tissue is taken (biopsy) to check for cancer cells. Often, the abnormal tissue can be removed during a test that examines the colon. These tests include:  Sigmoidoscopy. With this test, the caregiver can see inside your large intestine. A thin flexible tube is placed into your rectum. The device is called a sigmoidoscope. This device has a light and a tiny video camera in it. The caregiver uses the sigmoidoscope to look at the last third of your large intestine.   Colonoscopy. This test is like sigmoidoscopy, but the caregiver looks at all of the large intestine. It usually requires sedation.  If a biopsy was taken, a specialist in examining tissues (pathologist) checks the tissue for cancer cells using a microscope.  If the biopsy shows that cancer is present, the doctor needs to know the extent (stage) of the disease to plan the best treatment. The stage is based on whether the tumor has invaded nearby tissues, whether the cancer has spread, and if so, to what parts of the body. Staging may involve some of the following tests and procedures:  Blood tests. The doctor checks for carcinoembryonic antigen (CEA) and other substances in the blood. Some people who have colorectal cancer have a high CEA level.   Sigmoidoscopy.   Colonoscopy.   Endorectal ultrasound. An ultrasound probe is inserted into the rectum. The probe sends out sound waves that people cannot hear. The waves bounce off the rectum and nearby tissues, and a computer uses the echoes to create a picture. The picture shows how deep a rectal tumor has grown or whether the cancer has spread to lymph nodes or other nearby tissues.   Chest X-ray. X-rays of the chest can show whether cancer has spread to the lungs.   CT scan. This is a X-ray machine linked to a computer takes pictures of areas inside the body. The patient may receive an injection of dye. Tumors in the liver, lungs, or elsewhere in the body show up on the CT scan.  The doctor  also may use other tests (such as MRI) to see whether the cancer has spread. Sometimes staging is not complete until the patient has surgery to remove the tumor. (Surgery for colorectal cancer is described in the "Treatment" section.) DOCTORS DESCRIBE COLORECTAL CANCER BY THE FOLLOWING STAGES:   Stage 0. The cancer is found only in the innermost lining of the colon or rectum. Another name for Stage 0 colorectal cancer is Carcinoma in situ.   Stage I. The cancer has grown into the inner wall of the colon or rectum. The tumor has not reached the outer wall of the colon or extended outside the colon. Another name for Stage I colorectal cancer is Dukes' A.   Stage II. The tumor extends more deeply into or through the wall of the colon or rectum. It may have invaded nearby tissue, but cancer cells have not spread to the lymph nodes. Another name for Stage II colorectal cancer is   Dukes' B.   Stage III. The cancer has spread to nearby lymph nodes but not to other parts of the body. Another name for Stage III colorectal cancer is Dukes' C.   Stage IV. The cancer has spread to other parts of the body, such as the liver or lungs. Another name for Stage IV colorectal cancer is Dukes' D.   Recurrent cancer. This is cancer that has been treated and has returned after a period of time when the cancer could not be detected. The disease may return in the colon, rectum or in another part of the body.  Once you know the diagnosis and the stage of cancer of the colon, your caregiver can advise you and help you decide what the best course of treatment will be.  Document Released: 03/06/2005 Document Revised: 02/23/2011 Document Reviewed: 02/21/2011 [employer] Patient Information 2012 [employer] 

## 2011-06-23 NOTE — Discharge Summary (Signed)
Physician Discharge Summary  Patient ID: Vicki Woods MRN: [id number] DOB/AGE: 1932-02-26 78 y.o.  Admit date: 06/19/2011 Discharge date: 06/23/2011  Primary Care Physician:  Colette Ribas, MD, MD   Discharge Diagnoses:    Principal Problem:  *GI bleed due to newly diagnosed adenocarcinoma of the colon at the hepatic flexure Active Problems:  HYPERTENSION  Anemia due to acute blood loss.  Obesity  Hypokalemia    Medication List  As of 06/23/2011  2:48 PM   TAKE these medications         acetaminophen 500 MG tablet   Commonly known as: TYLENOL   Take 500 mg by mouth as needed.      amLODipine 10 MG tablet   Commonly known as: NORVASC   Take 5 mg by mouth daily.      captopril 50 MG tablet   Commonly known as: CAPOTEN   Take 25 mg by mouth 3 (three) times daily.      furosemide 20 MG tablet   Commonly known as: LASIX   Take 20 mg by mouth as needed.      hydrALAZINE 25 MG tablet   Commonly known as: APRESOLINE   Take 1 tablet (25 mg total) by mouth every 8 (eight) hours.      LIQUID TEARS 1.4 % ophthalmic solution   Generic drug: polyvinyl alcohol   Place 2 drops into both eyes daily as needed. For dry eyes      losartan-hydrochlorothiazide 100-25 MG per tablet   Commonly known as: HYZAAR   Take 0.5 tablets by mouth daily.           Discharge Exam: Blood pressure 102/67, pulse 74, temperature 97.6 F (36.4 C), temperature source Oral, resp. rate 18, height 5' (1.524 m), weight 81.647 kg (180 lb), SpO2 98.00%. NAD CTA B S1, S2, RRR Soft, NT, BS+ No edema b/l  Disposition and Follow-up:  Patient will follow up with her primary doctor in 2 weeks She will decide on which surgical group to follow up with as an outpatient She will also decide whether she wishes to follow up at the cancer center  Consults: Dr. Karilyn Cota, GI Dr. Leticia Penna, General Surgery   Significant Diagnostic Studies:  Ct Abdomen Pelvis W Contrast  06/22/2011  *RADIOLOGY REPORT*   Clinical Data: Large polyp hepatic flexure suspicious for malignancy. Post total hysterectomy.  CT ABDOMEN AND PELVIS WITH CONTRAST  Technique:  Multidetector CT imaging of the abdomen and pelvis was performed following the standard protocol during bolus administration of intravenous contrast.  Contrast:  100 ml Omnipaque-300.  Comparison: None.  Findings: Lung bases clear. Elevated left hemidiaphragm.  Minimal amount of free fluid is noted in Morison's pouch.  Greater amount of free fluid is seen right aspect of the pelvis.  Origin of the fluid is indeterminate.  The patient had recent colonoscopy and biopsy.  No free intraperitoneal air as may be seen with perforation.  If there are progressive symptoms, this possibility may need to be considered with follow-up imaging.  Within the liver, there are at two sub centimeter low density lesions which are too small to characterize and may represent small cysts.  With the patient's history, tiny metastatic disease not entirely excluded.  On the early phase imaging of the spleen, findings raise possibility of anterior 9 mm lesion and posterior 10 mm lesion. Given the normal heterogeneous enhancement the spleen, it is difficult to confirm that these represent true findings.  Bilateral adrenal gland enlargement.  These appear  of low density and may be related to adenomatous changes rather metastatic disease although not confirm with certainty.  Elongated normal-appearing inguinal lymph nodes.  Elongated external iliac lymph nodes more notable on the left with short axis dimension less than 1 cm.  Normal sized retroperitoneal, upper abdominal and mesenteric lymph nodes.  No focal pancreatic lesion.  Aortic root calcifications.  Coronary artery calcifications.  Heart slightly enlarged.  Atherosclerotic type changes of the aorta branch vessels.  No abdominal aortic aneurysm.  Mild narrowing proximal celiac artery and superior mesenteric artery.  Degenerative changes lower  thoracic and lumbar spine without bony destructive lesion.  Remote surgery with scar anterior lower abdominal/pelvic wall.  IMPRESSION: Minimal amount of fluid in Morison's pouch and small amount of fluid within the pelvis.  Etiology indeterminate.  Please see above discussion.  Too small to characterize liver lesions possibly 2 small cysts although metastatic lesions not entirely excluded.  Bilateral adrenal gland enlargement may represent changes of adenomatous hypertrophy but the cannot be confirmed with certainty on the current exam.  Question heterogeneous enhancement of the spleen versus small splenic lesions  Original Report Authenticated By: Fuller Canada, M.D.    Brief H and P: For complete details please refer to admission H and P, but in brief This is a very pleasant 78 year old female with past medical history of hypertension, GERD, obesity. Patient presents for hospital today with complaints of bloody stools. She reports onset of symptoms approximately 4 days ago. She had noted some dark-colored blood in her stools. This has persisted since then with her last bowel movement being last night. She's not had any further bleeding since last night. She does report some dizziness on standing. Denies any chest pain, shortness of breath. She has occasional heartburn. She denies any NSAID use. She denies ever having a colonoscopy or upper endoscopy before. She was evaluated in the emergency room where she was found to have guaiac positive stools. She's been referred for admission.     Hospital Course:  This lady was admitted to the hospital with complaints of bloody stools. She is admitted to a regular medical bed. She was seen in consultation by Dr. Dionicia Abler from gastroenterology. She initially underwent endoscopy which showed small sliding-type hernia with mild changes of reflux esophagitis limited to GE junction. A few small hyperplastic appearing polyps of the gastric body which were left alone.  Single erosion in the gastric body and pyloric channel inflammation but no stigmata of bleed. This was followed by colonoscopy which showed multiple polyps located in the ascending, hepatic flexure and proximal transverse colon. Small ulcerated rectal polyp which was snared. Unfortunately pathology came back consistent with adenocarcinoma at the hepatic flexure. Patient was seen in consultation by Dr. Leticia Penna for further surgical management. At this time the patient wishes to discuss further treatment options with her family. She is unsure which surgical group he was to followup with. He was stressed the importance to followup for further surgical intervention. She wishes to do this as an outpatient. Patient is no longer bleeding. She is has normal stools. Her hemoglobin did slightly drop but she has not required any transfusions. She is felt safe for discharge home today. She did have a CT abdomen and pelvis done which showed questionable lesions in the liver. She was offered to be set up with an outpatient appointment at the cancer center to discuss this further and a possible PET scan. She has declined this, but wants to have the telephone number for  the cancer center. In case she wishes to followup there, she will schedule her own appointment. This will be included in her discharge paperwork.  Time spent on Discharge:  Signed: Lilyrose Woods [facility] Pager: [phone] 06/23/2011, 2:48 PM

## 2011-06-23 NOTE — Progress Notes (Signed)
UR Chart Review Completed  

## 2011-06-23 NOTE — Progress Notes (Signed)
Patient received discharge instructions along with follow up appointments and prescriptions. Patient verbalized understanding of all instructions. Patient was escorted by staff via wheelchair to vehicle. Patient discharged home in stable condition.

## 2011-07-06 ENCOUNTER — Encounter (INDEPENDENT_AMBULATORY_CARE_PROVIDER_SITE_OTHER): Payer: Self-pay | Admitting: General Surgery

## 2011-07-06 ENCOUNTER — Telehealth (INDEPENDENT_AMBULATORY_CARE_PROVIDER_SITE_OTHER): Payer: Self-pay | Admitting: General Surgery

## 2011-07-06 NOTE — Telephone Encounter (Signed)
Appointment card mailed to patient for appt on 08/04/11.

## 2011-08-04 ENCOUNTER — Other Ambulatory Visit (INDEPENDENT_AMBULATORY_CARE_PROVIDER_SITE_OTHER): Payer: Self-pay | Admitting: Surgery

## 2011-08-04 ENCOUNTER — Encounter (INDEPENDENT_AMBULATORY_CARE_PROVIDER_SITE_OTHER): Payer: Self-pay | Admitting: Surgery

## 2011-08-04 ENCOUNTER — Ambulatory Visit (INDEPENDENT_AMBULATORY_CARE_PROVIDER_SITE_OTHER): Payer: Medicare Other | Admitting: Surgery

## 2011-08-04 VITALS — BP 166/84 | HR 72 | Temp 97.8°F | Resp 16 | Ht 60.0 in | Wt 195.4 lb

## 2011-08-04 DIAGNOSIS — C189 Malignant neoplasm of colon, unspecified: Secondary | ICD-10-CM

## 2011-08-04 NOTE — Progress Notes (Signed)
Chief Complaint:  Biopsied adenocarcinoma at the hepatic flexure  History of Present Illness:  Vicki Woods is an 78 y.o. female lady who was referred by Dr. Phillips Odor in Dr.Rehman after recent workup for anemia found multiple polyps in the descending colon. One that was felt to be in the hepatic flexure was flat and friable and proved to be an adenocarcinoma. According to Dr. Rosalia Hammers months note there is no indication that this was tattooed. CT scan was performed which did not show any conclusive evidence of metastatic disease. Her only prior surgery was a previous abdominal hysterectomy 50 years ago. A few years ago she began to the development of lower extremity edema or lymphedema. She's had a Doppler study which did not show any evidence of DVT according to her. She will need to admission following elective laparoscopically assisted right hemicolectomy.  Past Medical History  Diagnosis Date  . Hypertension   . GERD (gastroesophageal reflux disease)   . Proteinuria   . Headache   . Arthritis     knees,  shoulders  . Adenocarcinoma     At the hepatic flexure  . Leg swelling   . Rectal bleeding   . Weakness   . Blood in stool     Past Surgical History  Procedure Date  . Total abdominal hysterectomy w/ bilateral salpingoophorectomy   . Esophagogastroduodenoscopy 06/20/2011    Procedure: ESOPHAGOGASTRODUODENOSCOPY (EGD);  Surgeon: Malissa Hippo, MD;  [facility];  Service: Endoscopy;  Laterality: N/A;  . Colonoscopy 06/21/2011    Procedure: COLONOSCOPY;  Surgeon: Malissa Hippo, MD;  Location: [facility];  Service: Endoscopy;  Laterality: N/A;  . Abdominal hysterectomy 1963    Current Outpatient Prescriptions  Medication Sig Dispense Refill  . acetaminophen (TYLENOL) 500 MG tablet Take 500 mg by mouth as needed.       Marland Kitchen amLODipine (NORVASC) 10 MG tablet Take 5 mg by mouth daily.        . captopril (CAPOTEN) 50 MG tablet Take 25 mg by mouth 3 (three) times daily.        . furosemide (LASIX) 20 MG tablet Take 20 mg by mouth as needed.        . hydrALAZINE (APRESOLINE) 25 MG tablet Take 1 tablet (25 mg total) by mouth every 8 (eight) hours.  90 tablet  1  . losartan-hydrochlorothiazide (HYZAAR) 100-25 MG per tablet Take 0.5 tablets by mouth daily.      Marland Kitchen omeprazole (PRILOSEC OTC) 20 MG tablet Take 20 mg by mouth daily.      . polyvinyl alcohol (LIQUID TEARS) 1.4 % ophthalmic solution Place 2 drops into both eyes daily as needed. For dry eyes       Chocolate; Honey; Penicillins; Sulfonamide derivatives; Aspirin; and Codeine Family History  Problem Relation Age of Onset  . Ovarian cancer Mother   . Lung cancer Brother    Social History:   reports that she has never smoked. She has never used smokeless tobacco. She reports that she does not drink alcohol or use illicit drugs.   REVIEW OF SYSTEMS - PERTINENT POSITIVES ONLY: Negative for DVT  Physical Exam:   Blood pressure 166/84, pulse 72, temperature 97.8 F (36.6 C), temperature source Temporal, resp. rate 16, height 5' (1.524 m), weight 195 lb 6 oz (88.622 kg). Body mass index is 38.16 kg/(m^2).  Gen:  WDWNAfrican American female NAD  Neurological: Alert and oriented to person, place, and time. Motor and sensory function is grossly intact  Head: Normocephalic and atraumatic.  Eyes: Conjunctivae are normal. Pupils are equal, round, and reactive to light. No scleral icterus.  Neck: Normal range of motion. Neck supple. No tracheal deviation or thyromegaly present.  Cardiovascular:  SR without murmurs or gallops.  No carotid bruits Respiratory: Effort normal.  No respiratory distress. No chest wall tenderness. Breath sounds normal.  No wheezes, rales or rhonchi.  Abdomen:    nontender with lower midline incision from hysterectomy. GU: Musculoskeletal: Normal range of motion. Extremities are nontender. No cyanosis, edema or clubbing noted Lymphadenopathy: No cervical, preauricular, postauricular or  axillary adenopathy is present Skin: Skin is warm and dry. No rash noted. No diaphoresis. No erythema. No pallor. Pscyh: Normal mood and affect. Behavior is normal. Judgment and thought content normal. lymphedema present in the lower extremity LABORATORY RESULTS: No results found for this or any previous visit (from the past 48 hour(s)).  RADIOLOGY RESULTS: No results found.  Problem List: Patient Active Problem List  Diagnoses  . HYPERTENSION  . MASS, LOCALIZED, SUPERFICIAL  . CARDIAC MURMUR  . CHEST PAIN  . HEARTBURN  . GI bleed  . Anemia  . Obesity  . Hypokalemia    Assessment & Plan: Cancer in a adenomatous polyp taken from the hepatic flexure. Plan lap-assisted partial colectomy.    Matt B. Daphine Deutscher, MD, [facility] [phone] beeper [phone]  08/04/2011 2:02 PM

## 2011-09-04 ENCOUNTER — Encounter (HOSPITAL_COMMUNITY): Payer: Self-pay | Admitting: Pharmacy Technician

## 2011-09-04 ENCOUNTER — Ambulatory Visit (HOSPITAL_COMMUNITY): Payer: Medicare Other | Admitting: Oncology

## 2011-09-06 ENCOUNTER — Encounter (HOSPITAL_COMMUNITY)
Admission: RE | Admit: 2011-09-06 | Discharge: 2011-09-06 | Disposition: A | Discharge status: Home / Self Care | Payer: Medicare Other | Source: Ambulatory Visit | Attending: Surgery | Admitting: Surgery

## 2011-09-06 ENCOUNTER — Telehealth (INDEPENDENT_AMBULATORY_CARE_PROVIDER_SITE_OTHER): Payer: Self-pay | Admitting: General Surgery

## 2011-09-06 ENCOUNTER — Encounter (HOSPITAL_COMMUNITY): Payer: Self-pay

## 2011-09-06 LAB — CBC
HCT: 33.4 % — ABNORMAL LOW (ref 36.0–46.0)
Hemoglobin: 10.5 g/dL — ABNORMAL LOW (ref 12.0–15.0)
MCH: 27.6 pg (ref 26.0–34.0)
MCHC: 31.4 g/dL (ref 30.0–36.0)
MCV: 87.9 fL (ref 78.0–100.0)

## 2011-09-06 LAB — BASIC METABOLIC PANEL
BUN: 25 mg/dL — ABNORMAL HIGH (ref 6–23)
GFR calc non Af Amer: 46 mL/min — ABNORMAL LOW (ref >90)
Glucose, Bld: 94 mg/dL (ref 70–99)
Potassium: 3.5 mEq/L (ref 3.5–5.1)

## 2011-09-06 NOTE — Progress Notes (Signed)
09/06/11 Please note abnormal BUN and CREA results from preop appointment .

## 2011-09-06 NOTE — Progress Notes (Signed)
09/06/11 Patient did not sign consent on preop appointment of 09/06/11 due to patient wanted to have discussion with Dr Daphine Deutscher regarding items listed on 3.  Of the operative consent.  Nurse instructed patient that Dr Daphine Deutscher would talk with her am of surgery .

## 2011-09-06 NOTE — Progress Notes (Signed)
09/06/11  Patient is allergic to Penicillins.  Cefoxitin is ordered preop for surgery 09/13/11.  Please order in EPIC antibioitc for patient.

## 2011-09-06 NOTE — Progress Notes (Signed)
09/06/11 Orders in EPIC mention fleets enema .  Patient has bowel prep instruction sheet for surgery.  Please clarify with patient bowel prep instructions.

## 2011-09-06 NOTE — Progress Notes (Signed)
09/06/11 Spoke with Germaine at office at time of preop.  Patient stated that nephrologist ? Wanted her to stop medications prior to surgery and that  This should have been communicated with Dr Daphine Deutscher.  Nothing noted in EPIC.  Verified current med list pt is on at time of preop appointment.  Germaine stated that she would notify French Ana, nurse of Dr Daphine Deutscher who would contact patient either 09/06/11 or 09/07/11 to clarify.  Patient instructed to call office of [facility] and speak with French Ana if she has not heard from her  by 09/07/11.  Patient voiced understanding.

## 2011-09-06 NOTE — Patient Instructions (Addendum)
20 Vicki Woods  09/06/2011   Your procedure is scheduled on:  09/13/11 1200noon-200pm  Report to [facility] at 0930 AM.  Call this number if you have problems the morning of surgery: [phone]   Remember: Fleets enema nite before surgery    Do not eat food:After Midnight.  May have clear liquids:until Midnight .   Take these medicines the morning of surgery with A SIP OF WATER:    Do not wear jewelry, make-up or nail polish.  Do not wear lotions, powders, or perfumes..  Do not shave 48 hours prior to surgery..  Do not bring valuables to the hospital.  Contacts, dentures or bridgework may not be worn into surgery.  Leave suitcase in the car. After surgery it may be brought to your room.  For patients admitted to the hospital, checkout time is 11:00 AM the day of discharge.     Special Instructions: CHG Shower Use Special Wash: 1/2 bottle night before surgery and 1/2 bottle morning of surgery. shower chin to toes with CHG.  Wash face and private parts with regular soap.    Please read over the following fact sheets that you were given: MRSA Information, coughing and deep breathing exercises, leg exercises, Blood Transfusion Fact Sheet

## 2011-09-06 NOTE — Telephone Encounter (Signed)
CARLA CALLED TO ASK IF DR. MARTIN'S NURSE COULD CALL PATIENT RE MEDICATIONS HER RENAL DR. TOLD HER TO STOP OR ADJUST. SHE WANTED TO MAKE SURE IT WAS OK WITH DR. MARTIN. HER SURGERY IS 09-13-11/ PH- [phone]/ THANKS GY

## 2011-09-06 NOTE — Pre-Procedure Instructions (Signed)
CBC routed to Dr Daphine Deutscher along with BMET results done 09/06/11 on preop appointment.

## 2011-09-08 ENCOUNTER — Telehealth (INDEPENDENT_AMBULATORY_CARE_PROVIDER_SITE_OTHER): Payer: Self-pay | Admitting: General Surgery

## 2011-09-08 NOTE — Pre-Procedure Instructions (Signed)
09/08/11 Vicki Woods , nurse of Dr Daphine Deutscher stated that all issues in progress notes that she could address have been addressed with patient.

## 2011-09-08 NOTE — Telephone Encounter (Signed)
Pt calling to see if she can color her hair before surgery on Wed.  Assured her she can do so.

## 2011-09-12 ENCOUNTER — Telehealth (INDEPENDENT_AMBULATORY_CARE_PROVIDER_SITE_OTHER): Payer: Self-pay | Admitting: General Surgery

## 2011-09-12 NOTE — Telephone Encounter (Signed)
The patient called to state that she was unable to get her rx for neomycin filled because the pharmacy did not have it, I expressed to her that she should try another pharmacy. She is to take erythromycin 1 gm #3 and neomycin 1gm #3 for her bowel prep for tomorrows surgery. Paged Dr Daphine Deutscher patient should take the erythromycin and neomycin, if she is unable to get it from any pharmacy she needs to take at least the erythromycin.

## 2011-09-12 NOTE — Telephone Encounter (Signed)
Contacted the patient to advise her of Dr Norva Riffle advise, she stated she would try to get them both now,  and then stated she could not take any capsules. She stated she was not going to try to get the abx until after she finished her nulytely prep.

## 2011-09-13 ENCOUNTER — Ambulatory Visit (HOSPITAL_COMMUNITY): Payer: Medicare Other | Admitting: Anesthesiology

## 2011-09-13 ENCOUNTER — Encounter (HOSPITAL_COMMUNITY): Payer: Self-pay | Admitting: Anesthesiology

## 2011-09-13 ENCOUNTER — Encounter (HOSPITAL_COMMUNITY)
Admission: RE | Disposition: A | Discharge status: Skilled Nursing Facility | Payer: Self-pay | Source: Ambulatory Visit | Attending: Surgery

## 2011-09-13 ENCOUNTER — Encounter (HOSPITAL_COMMUNITY): Payer: Self-pay | Admitting: *Deleted

## 2011-09-13 ENCOUNTER — Inpatient Hospital Stay (HOSPITAL_COMMUNITY)
Admission: RE | Admit: 2011-09-13 | Discharge: 2011-09-19 | DRG: 330 | Disposition: A | Discharge status: Skilled Nursing Facility | Payer: Medicare Other | Source: Ambulatory Visit | Attending: Surgery | Admitting: Surgery

## 2011-09-13 DIAGNOSIS — E669 Obesity, unspecified: Secondary | ICD-10-CM | POA: Diagnosis present

## 2011-09-13 DIAGNOSIS — K921 Melena: Secondary | ICD-10-CM | POA: Diagnosis present

## 2011-09-13 DIAGNOSIS — C183 Malignant neoplasm of hepatic flexure: Principal | ICD-10-CM | POA: Diagnosis present

## 2011-09-13 DIAGNOSIS — E876 Hypokalemia: Secondary | ICD-10-CM | POA: Diagnosis not present

## 2011-09-13 DIAGNOSIS — I10 Essential (primary) hypertension: Secondary | ICD-10-CM | POA: Diagnosis present

## 2011-09-13 DIAGNOSIS — Z01812 Encounter for preprocedural laboratory examination: Secondary | ICD-10-CM

## 2011-09-13 DIAGNOSIS — C189 Malignant neoplasm of colon, unspecified: Secondary | ICD-10-CM

## 2011-09-13 HISTORY — PX: HEMICOLECTOMY: SHX854

## 2011-09-13 HISTORY — DX: Malignant neoplasm of colon, unspecified: C18.9

## 2011-09-13 LAB — CBC
HCT: 30.1 % — ABNORMAL LOW (ref 36.0–46.0)
Hemoglobin: 9.9 g/dL — ABNORMAL LOW (ref 12.0–15.0)
MCH: 28.4 pg (ref 26.0–34.0)
MCV: 86.2 fL (ref 78.0–100.0)
Platelets: 242 10*3/uL (ref 150–400)
RBC: 3.49 MIL/uL — ABNORMAL LOW (ref 3.87–5.11)
WBC: 10.5 10*3/uL (ref 4.0–10.5)

## 2011-09-13 LAB — CREATININE, SERUM: GFR calc Af Amer: 80 mL/min — ABNORMAL LOW (ref >90)

## 2011-09-13 LAB — MRSA PCR SCREENING: MRSA by PCR: NEGATIVE

## 2011-09-13 SURGERY — LAPAROSCOPIC PARTIAL COLECTOMY
Anesthesia: General | Site: Abdomen | Wound class: Clean Contaminated

## 2011-09-13 MED ORDER — SODIUM CHLORIDE 0.9 % IV SOLN
INTRAVENOUS | Status: DC | PRN
  Administered 2011-09-13: 20 mL via INTRAMUSCULAR

## 2011-09-13 MED ORDER — HEPARIN SODIUM (PORCINE) 5000 UNIT/ML IJ SOLN
INTRAMUSCULAR | Status: AC
  Filled 2011-09-13: qty 1

## 2011-09-13 MED ORDER — NEOSTIGMINE METHYLSULFATE 1 MG/ML IJ SOLN
INTRAMUSCULAR | Status: DC | PRN
  Administered 2011-09-13: 4 mg via INTRAVENOUS

## 2011-09-13 MED ORDER — BUPIVACAINE LIPOSOME 1.3 % IJ SUSP
20.0000 mL | Freq: Once | INTRAMUSCULAR | Status: DC
  Filled 2011-09-13: qty 20

## 2011-09-13 MED ORDER — CIPROFLOXACIN IN D5W 400 MG/200ML IV SOLN
INTRAVENOUS | Status: AC
  Filled 2011-09-13: qty 200

## 2011-09-13 MED ORDER — FUROSEMIDE 20 MG PO TABS
20.0000 mg | ORAL_TABLET | Freq: Every day | ORAL | Status: DC | PRN
Start: 2011-09-13 — End: 2011-09-19
  Filled 2011-09-13: qty 1

## 2011-09-13 MED ORDER — ACETAMINOPHEN 10 MG/ML IV SOLN
INTRAVENOUS | Status: DC | PRN
  Administered 2011-09-13: 1000 mg via INTRAVENOUS

## 2011-09-13 MED ORDER — HEPARIN SODIUM (PORCINE) 5000 UNIT/ML IJ SOLN
5000.0000 [IU] | Freq: Once | INTRAMUSCULAR | Status: AC
  Administered 2011-09-13: 5000 [IU] via SUBCUTANEOUS

## 2011-09-13 MED ORDER — KCL IN DEXTROSE-NACL 20-5-0.45 MEQ/L-%-% IV SOLN
INTRAVENOUS | Status: DC
  Administered 2011-09-13: 16:00:00 via INTRAVENOUS
  Administered 2011-09-14: 100 mL/h via INTRAVENOUS
  Administered 2011-09-14: 1000 mL via INTRAVENOUS
  Administered 2011-09-15 (×3): via INTRAVENOUS
  Administered 2011-09-16: 75 mL/h via INTRAVENOUS
  Administered 2011-09-16: 22:00:00 via INTRAVENOUS
  Administered 2011-09-17: 50 mL/h via INTRAVENOUS
  Administered 2011-09-18 – 2011-09-19 (×2): via INTRAVENOUS
  Filled 2011-09-13 (×11): qty 1000

## 2011-09-13 MED ORDER — ACETAMINOPHEN 10 MG/ML IV SOLN
1000.0000 mg | Freq: Four times a day (QID) | INTRAVENOUS | Status: AC
  Administered 2011-09-13 – 2011-09-14 (×3): 1000 mg via INTRAVENOUS
  Filled 2011-09-13 (×4): qty 100

## 2011-09-13 MED ORDER — AMLODIPINE BESYLATE 2.5 MG PO TABS
2.5000 mg | ORAL_TABLET | Freq: Every day | ORAL | Status: DC
  Administered 2011-09-13 – 2011-09-19 (×7): 2.5 mg via ORAL
  Filled 2011-09-13 (×7): qty 1

## 2011-09-13 MED ORDER — LACTATED RINGERS IV SOLN
INTRAVENOUS | Status: DC | PRN
  Administered 2011-09-13 (×2): via INTRAVENOUS

## 2011-09-13 MED ORDER — HYDROMORPHONE HCL PF 1 MG/ML IJ SOLN: 0.2500 mg | INTRAMUSCULAR | Status: DC | PRN

## 2011-09-13 MED ORDER — KCL IN DEXTROSE-NACL 20-5-0.45 MEQ/L-%-% IV SOLN
INTRAVENOUS | Status: AC
  Filled 2011-09-13: qty 1000

## 2011-09-13 MED ORDER — ACETAMINOPHEN 10 MG/ML IV SOLN
INTRAVENOUS | Status: AC
  Filled 2011-09-13: qty 100

## 2011-09-13 MED ORDER — CLINDAMYCIN PHOSPHATE 600 MG/50ML IV SOLN
INTRAVENOUS | Status: DC | PRN
  Administered 2011-09-13: 600 mg via INTRAVENOUS

## 2011-09-13 MED ORDER — HEPARIN SODIUM (PORCINE) 5000 UNIT/ML IJ SOLN
5000.0000 [IU] | Freq: Three times a day (TID) | INTRAMUSCULAR | Status: DC
  Administered 2011-09-14 – 2011-09-19 (×14): 5000 [IU] via SUBCUTANEOUS
  Filled 2011-09-13 (×19): qty 1

## 2011-09-13 MED ORDER — LACTATED RINGERS IV SOLN: INTRAVENOUS | Status: DC

## 2011-09-13 MED ORDER — CISATRACURIUM BESYLATE (PF) 10 MG/5ML IV SOLN
INTRAVENOUS | Status: DC | PRN
  Administered 2011-09-13: 4 mg via INTRAVENOUS
  Administered 2011-09-13: 8 mg via INTRAVENOUS
  Administered 2011-09-13: 2 mg via INTRAVENOUS

## 2011-09-13 MED ORDER — PROPOFOL 10 MG/ML IV BOLUS
INTRAVENOUS | Status: DC | PRN
  Administered 2011-09-13: 130 mg via INTRAVENOUS

## 2011-09-13 MED ORDER — LACTATED RINGERS IR SOLN
Status: DC | PRN
  Administered 2011-09-13: 1000 mL

## 2011-09-13 MED ORDER — BUPIVACAINE LIPOSOME 1.3 % IJ SUSP
INTRAMUSCULAR | Status: DC | PRN
  Administered 2011-09-13: 20 mL

## 2011-09-13 MED ORDER — CLINDAMYCIN PHOSPHATE 600 MG/50ML IV SOLN
INTRAVENOUS | Status: AC
  Filled 2011-09-13: qty 50

## 2011-09-13 MED ORDER — LIDOCAINE HCL (CARDIAC) 20 MG/ML IV SOLN
INTRAVENOUS | Status: DC | PRN
  Administered 2011-09-13: 50 mg via INTRAVENOUS

## 2011-09-13 MED ORDER — CIPROFLOXACIN IN D5W 400 MG/200ML IV SOLN
INTRAVENOUS | Status: DC | PRN
  Administered 2011-09-13: 400 mg via INTRAVENOUS

## 2011-09-13 MED ORDER — CEFOXITIN SODIUM 1 G IV SOLR: 1.0000 g | INTRAVENOUS | Status: DC

## 2011-09-13 MED ORDER — GLYCOPYRROLATE 0.2 MG/ML IJ SOLN
INTRAMUSCULAR | Status: DC | PRN
  Administered 2011-09-13: 0.6 mg via INTRAVENOUS

## 2011-09-13 MED ORDER — CHLORHEXIDINE GLUCONATE 4 % EX LIQD: 1.0000 "application " | Freq: Once | CUTANEOUS | Status: DC

## 2011-09-13 MED ORDER — ONDANSETRON HCL 4 MG/2ML IJ SOLN
INTRAMUSCULAR | Status: DC | PRN
  Administered 2011-09-13: 4 mg via INTRAVENOUS

## 2011-09-13 MED ORDER — HYDROMORPHONE HCL PF 1 MG/ML IJ SOLN
INTRAMUSCULAR | Status: DC | PRN
  Administered 2011-09-13: .2 mg via INTRAVENOUS
  Administered 2011-09-13 (×2): .4 mg via INTRAVENOUS

## 2011-09-13 MED ORDER — HYDROMORPHONE HCL PF 1 MG/ML IJ SOLN: 0.5000 mg | INTRAMUSCULAR | Status: DC | PRN

## 2011-09-13 MED ORDER — FENTANYL CITRATE 0.05 MG/ML IJ SOLN
INTRAMUSCULAR | Status: DC | PRN
  Administered 2011-09-13: 100 ug via INTRAVENOUS
  Administered 2011-09-13: 50 ug via INTRAVENOUS
  Administered 2011-09-13: 100 ug via INTRAVENOUS

## 2011-09-13 MED ORDER — OXYCODONE-ACETAMINOPHEN 5-325 MG PO TABS
1.0000 | ORAL_TABLET | ORAL | Status: DC | PRN
  Administered 2011-09-13: 1 via ORAL
  Filled 2011-09-13: qty 1

## 2011-09-13 MED ORDER — SUCCINYLCHOLINE CHLORIDE 20 MG/ML IJ SOLN
INTRAMUSCULAR | Status: DC | PRN
  Administered 2011-09-13: 100 mg via INTRAVENOUS

## 2011-09-13 SURGICAL SUPPLY — 73 items
APPLIER CLIP 5 13 M/L LIGAMAX5 (MISCELLANEOUS) ×2
APPLIER CLIP ROT 10 11.4 M/L (STAPLE) ×2
APR CLP MED LRG 11.4X10 (STAPLE) ×1
APR CLP MED LRG 5 ANG JAW (MISCELLANEOUS) ×1
BLADE EXTENDED COATED 6.5IN (ELECTRODE) ×2 IMPLANT
BLADE HEX COATED 2.75 (ELECTRODE) ×2 IMPLANT
BLADE SURG SZ10 CARB STEEL (BLADE) ×2 IMPLANT
CABLE HIGH FREQUENCY MONO STRZ (ELECTRODE) ×2 IMPLANT
CANISTER SUCTION 2500CC (MISCELLANEOUS) ×2 IMPLANT
CELLS DAT CNTRL 66122 CELL SVR (MISCELLANEOUS) ×1 IMPLANT
CLIP APPLIE 5 13 M/L LIGAMAX5 (MISCELLANEOUS) ×1 IMPLANT
CLIP APPLIE ROT 10 11.4 M/L (STAPLE) ×1 IMPLANT
CLOTH BEACON ORANGE TIMEOUT ST (SAFETY) ×2 IMPLANT
COVER MAYO STAND STRL (DRAPES) ×2 IMPLANT
DECANTER SPIKE VIAL GLASS SM (MISCELLANEOUS) ×1 IMPLANT
DRAIN CHANNEL 19F RND (DRAIN) ×1 IMPLANT
DRAPE LAPAROSCOPIC ABDOMINAL (DRAPES) ×2 IMPLANT
DRAPE LG THREE QUARTER DISP (DRAPES) ×2 IMPLANT
DRAPE WARM FLUID 44X44 (DRAPE) ×2 IMPLANT
ELECT REM PT RETURN 9FT ADLT (ELECTROSURGICAL) ×2
ELECTRODE REM PT RTRN 9FT ADLT (ELECTROSURGICAL) ×1 IMPLANT
GLOVE BIOGEL M 8.0 STRL (GLOVE) ×4 IMPLANT
GLOVE BIOGEL PI IND STRL 7.0 (GLOVE) ×1 IMPLANT
GLOVE BIOGEL PI INDICATOR 7.0 (GLOVE) ×2
GOWN STRL NON-REIN LRG LVL3 (GOWN DISPOSABLE) ×4 IMPLANT
GOWN STRL REIN XL XLG (GOWN DISPOSABLE) ×4 IMPLANT
GRASPER LAPSCPC 5X35 EPIX (ENDOMECHANICALS) IMPLANT
HAND ACTIVATED (MISCELLANEOUS) ×1 IMPLANT
KIT BASIN OR (CUSTOM PROCEDURE TRAY) ×2 IMPLANT
LEGGING LITHOTOMY PAIR STRL (DRAPES) IMPLANT
LIGASURE IMPACT 36 18CM CVD LR (INSTRUMENTS) IMPLANT
NS IRRIG 1000ML POUR BTL (IV SOLUTION) ×3 IMPLANT
PENCIL BUTTON HOLSTER BLD 10FT (ELECTRODE) ×2 IMPLANT
RELOAD PROXIMATE 75MM BLUE (ENDOMECHANICALS) ×4 IMPLANT
RELOAD STAPLE 75 3.8 BLU REG (ENDOMECHANICALS) IMPLANT
RETRACTOR WND ALEXIS 18 MED (MISCELLANEOUS) IMPLANT
RTRCTR WOUND ALEXIS 18CM MED (MISCELLANEOUS) ×2
SCISSORS LAP 5X35 DISP (ENDOMECHANICALS) ×2 IMPLANT
SEALER TISSUE G2 CVD JAW 35 (ENDOMECHANICALS) IMPLANT
SEALER TISSUE G2 CVD JAW 45CM (ENDOMECHANICALS)
SET IRRIG TUBING LAPAROSCOPIC (IRRIGATION / IRRIGATOR) ×2 IMPLANT
SOLUTION ANTI FOG 6CC (MISCELLANEOUS) ×2 IMPLANT
SPONGE GAUZE 4X4 12PLY (GAUZE/BANDAGES/DRESSINGS) ×2 IMPLANT
SPONGE LAP 18X18 X RAY DECT (DISPOSABLE) ×4 IMPLANT
STAPLER PROXIMATE 75MM BLUE (STAPLE) ×1 IMPLANT
STAPLER VISISTAT 35W (STAPLE) ×2 IMPLANT
SUCTION POOLE TIP (SUCTIONS) ×2 IMPLANT
SUT NOVA 1 T20/GS 25DT (SUTURE) ×2 IMPLANT
SUT PDS AB 1 CTX 36 (SUTURE) ×4 IMPLANT
SUT PDS AB 1 TP1 96 (SUTURE) IMPLANT
SUT PDS AB 4-0 SH 27 (SUTURE) ×2 IMPLANT
SUT PROLENE 2 0 KS (SUTURE) IMPLANT
SUT SILK 2 0 (SUTURE) ×2
SUT SILK 2 0 SH CR/8 (SUTURE) ×2 IMPLANT
SUT SILK 2-0 18XBRD TIE 12 (SUTURE) ×1 IMPLANT
SUT SILK 3 0 (SUTURE) ×2
SUT SILK 3 0 SH CR/8 (SUTURE) ×3 IMPLANT
SUT SILK 3-0 18XBRD TIE 12 (SUTURE) ×1 IMPLANT
SUT VIC AB 2-0 SH 18 (SUTURE) ×4 IMPLANT
SUT VICRYL 2 0 18  UND BR (SUTURE) ×2
SUT VICRYL 2 0 18 UND BR (SUTURE) ×2 IMPLANT
SYR 30ML LL (SYRINGE) ×2 IMPLANT
SYS LAPSCP GELPORT 120MM (MISCELLANEOUS)
SYSTEM LAPSCP GELPORT 120MM (MISCELLANEOUS) IMPLANT
TOWEL OR 17X26 10 PK STRL BLUE (TOWEL DISPOSABLE) ×4 IMPLANT
TRAY FOLEY CATH 14FRSI W/METER (CATHETERS) ×2 IMPLANT
TRAY LAP CHOLE (CUSTOM PROCEDURE TRAY) ×2 IMPLANT
TROCAR XCEL BLUNT TIP 100MML (ENDOMECHANICALS) IMPLANT
TROCAR XCEL NON-BLD 11X100MML (ENDOMECHANICALS) IMPLANT
TROCAR XCEL NON-BLD 5MMX100MML (ENDOMECHANICALS) ×4 IMPLANT
TUBING FILTER THERMOFLATOR (ELECTROSURGICAL) ×2 IMPLANT
YANKAUER SUCT BULB TIP 10FT TU (MISCELLANEOUS) ×2 IMPLANT
YANKAUER SUCT BULB TIP NO VENT (SUCTIONS) ×2 IMPLANT

## 2011-09-13 NOTE — Anesthesia Postprocedure Evaluation (Signed)
  Anesthesia Post-op Note  Patient: Vicki Woods  Procedure(s) Performed: Procedure(s) (LRB): LAPAROSCOPIC PARTIAL COLECTOMY (N/A)  Patient Location: PACU  Anesthesia Type: General  Level of Consciousness: oriented and sedated  Airway and Oxygen Therapy: Patient Spontanous Breathing and Patient connected to nasal cannula oxygen  Post-op Pain: mild  Post-op Assessment: Post-op Vital signs reviewed, Patient's Cardiovascular Status Stable, Respiratory Function Stable and Patent Airway  Post-op Vital Signs: stable  Complications: No apparent anesthesia complications

## 2011-09-13 NOTE — Progress Notes (Signed)
Report given to Zella Ball, R.N. For continued PACU CARE.

## 2011-09-13 NOTE — H&P (Addendum)
Chief Complaint: Biopsied adenocarcinoma at the hepatic flexure  History of Present Illness: Vicki Woods is an 78 y.o. female lady who was referred by Dr. Phillips Odor in Dr.Rehman after recent workup for anemia found multiple polyps in the descending colon. One that was felt to be in the hepatic flexure was flat and friable and proved to be an adenocarcinoma. According to Dr. Rosalia Hammers months note there is no indication that this was tattooed. CT scan was performed which did not show any conclusive evidence of metastatic disease. Her only prior surgery was a previous abdominal hysterectomy 50 years ago. A few years ago she began to the development of lower extremity edema or lymphedema. She's had a Doppler study which did not show any evidence of DVT according to her. She will need to admission following elective laparoscopically assisted right hemicolectomy.  Past Medical History   Diagnosis  Date   .  Hypertension    .  GERD (gastroesophageal reflux disease)    .  Proteinuria    .  Headache    .  Arthritis      knees, shoulders   .  Adenocarcinoma      At the hepatic flexure   .  Leg swelling    .  Rectal bleeding    .  Weakness    .  Blood in stool     Past Surgical History   Procedure  Date   .  Total abdominal hysterectomy w/ bilateral salpingoophorectomy    .  Esophagogastroduodenoscopy  06/20/2011     Procedure: ESOPHAGOGASTRODUODENOSCOPY (EGD); Surgeon: Malissa Hippo, MD; Location: [facility]; Service: Endoscopy; Laterality: N/A;   .  Colonoscopy  06/21/2011     Procedure: COLONOSCOPY; Surgeon: Malissa Hippo, MD; Location: [facility]; Service: Endoscopy; Laterality: N/A;   .  Abdominal hysterectomy  1963    Current Outpatient Prescriptions   Medication  Sig  Dispense  Refill   .  acetaminophen (TYLENOL) 500 MG tablet  Take 500 mg by mouth as needed.     Marland Kitchen  amLODipine (NORVASC) 10 MG tablet  Take 5 mg by mouth daily.     .  captopril (CAPOTEN) 50 MG tablet  Take 25 mg by mouth  3 (three) times daily.     .  furosemide (LASIX) 20 MG tablet  Take 20 mg by mouth as needed.     .  hydrALAZINE (APRESOLINE) 25 MG tablet  Take 1 tablet (25 mg total) by mouth every 8 (eight) hours.  90 tablet  1   .  losartan-hydrochlorothiazide (HYZAAR) 100-25 MG per tablet  Take 0.5 tablets by mouth daily.     Marland Kitchen  omeprazole (PRILOSEC OTC) 20 MG tablet  Take 20 mg by mouth daily.     .  polyvinyl alcohol (LIQUID TEARS) 1.4 % ophthalmic solution  Place 2 drops into both eyes daily as needed. For dry eyes      Chocolate; Honey; Penicillins; Sulfonamide derivatives; Aspirin; and Codeine  Family History   Problem  Relation  Age of Onset   .  Ovarian cancer  Mother    .  Lung cancer  Brother     Social History: reports that she has never smoked. She has never used smokeless tobacco. She reports that she does not drink alcohol or use illicit drugs.  REVIEW OF SYSTEMS - PERTINENT POSITIVES ONLY:  Negative for DVT  Physical Exam:  Blood pressure 166/84, pulse 72, temperature 97.8 F (36.6 C), temperature source  Temporal, resp. rate 16, height 5' (1.524 m), weight 195 lb 6 oz (88.622 kg).  Body mass index is 38.16 kg/(m^2).  Gen: WDWNAfrican American female NAD  Neurological: Alert and oriented to person, place, and time. Motor and sensory function is grossly intact  Head: Normocephalic and atraumatic.  Eyes: Conjunctivae are normal. Pupils are equal, round, and reactive to light. No scleral icterus.  Neck: Normal range of motion. Neck supple. No tracheal deviation or thyromegaly present.  Cardiovascular: SR without murmurs or gallops. No carotid bruits  Respiratory: Effort normal. No respiratory distress. No chest wall tenderness. Breath sounds normal. No wheezes, rales or rhonchi.  Abdomen: nontender with lower midline incision from hysterectomy.  GU:  Musculoskeletal: Normal range of motion. Extremities are nontender. No cyanosis, edema or clubbing noted Lymphadenopathy: No cervical,  preauricular, postauricular or axillary adenopathy is present Skin: Skin is warm and dry. No rash noted. No diaphoresis. No erythema. No pallor. Pscyh: Normal mood and affect. Behavior is normal. Judgment and thought content normal. lymphedema present in the lower extremity  LABORATORY RESULTS:  No results found for this or any previous visit (from the past 48 hour(s)).  RADIOLOGY RESULTS:  No results found.  Problem List:  Patient Active Problem List   Diagnoses   .  HYPERTENSION   .  MASS, LOCALIZED, SUPERFICIAL   .  CARDIAC MURMUR   .  CHEST PAIN   .  HEARTBURN   .  GI bleed   .  Anemia   .  Obesity   .  Hypokalemia    Assessment & Plan:  Cancer in a adenomatous polyp taken from the hepatic flexure. Plan lap-assisted partial colectomy.  Matt B. Daphine Deutscher, MD, [facility]  [phone] beeper  [phone]  There has been no change in the patient's past medical history or physical exam in the past 24 hours to the best of my knowledge. I examined the patient in the holding area and have made any changes to the history and physical exam report that is included above.   Expectations and outcome results have been discussed with the patient to include risks and benefits.  All questions have been answered and we will proceed with previously discussed procedure noted and signed in the consent form in the patient's record.  I answered many questions that she had and heard about the toe incident with the compression stockings.  She is very anxious about surgery and I think focused on this but I don't think that her toe pain is related to the compression stocking machine.   Trenise Turay BMD FACS 11:45 AM  09/13/2011

## 2011-09-13 NOTE — Transfer of Care (Signed)
Immediate Anesthesia Transfer of Care Note  Patient: Vicki Woods  Procedure(s) Performed: Procedure(s) (LRB): LAPAROSCOPIC PARTIAL COLECTOMY (N/A)  Patient Location: PACU  Anesthesia Type: General  Level of Consciousness: awake, sedated and patient cooperative  Airway & Oxygen Therapy: Patient Spontanous Breathing and Patient connected to face mask oxygen  Post-op Assessment: Report given to PACU RN and Post -op Vital signs reviewed and stable  Post vital signs: Reviewed and stable  Complications: No apparent anesthesia complications

## 2011-09-13 NOTE — Progress Notes (Signed)
Patient able to wiggle all toes on both feet. Bilateral dorsalis pedis pulses palpable;patient states she can feel both feet being touched and denies tingling or numbness of any toes- including right great toe- no tingling or numbness in either foot also; toes blanche satisfactorily.

## 2011-09-13 NOTE — Op Note (Signed)
Surgeon: Wenda Low, MD, FACS  Asst:  Carman Ching M.D. FACS  Anes:  Gen. 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 Abdomen was prepped with PCMX and draped sterilely. A Foley catheter had been previously inserted. Access to the abdomen was achieved to the left upper quadrant using a 0 5 mm Optiview without difficulty. 3 other ports were placed and through these I did a mobilization of the right colon from the cecum up to the hepatic flexure and across from that including the transverse colon. I was able to get this very mobile.  The tumor had been described by Dr. Pamala Hurry as being and hepatic flexure. I elected to make a longitudinal incision just above the umbilicus making approximately 9 cm long entering the abdomen. I was able to mobilize and exteriorize the right colon through this incision. We could feel a mass that corresponded to the location of the tumor as described. I divided the small bowel proximally and distally with the GIA-75. I resected the specimen going down to the mesentery with Kelly clamps harmonic scalpel and tying off with 2-0 silk ties. The bowel was then placed side-by-side together in the mesentery closed with interrupted 2-0 silk. A functional end-to-end anastomosis was created inserting the limbs of the stapler in either end of the bowel and then firing it. A good a good anastomosis was present. The common defect was closed meter in with 2 layers using 4-0 PDS in the inner layer and interrupted 3-0 silk on the outside. As enteric defect had previously been closed. The specimen was checked in the colon was opened and the frankly obvious cancer was seen. Multiple polyps were seen in the  transverse colon or distal to the cancer.  I changed my gloves. Sponge and needle counts reported as correct. The midline incision was closed interrupted #1 Novafil. As it irrigated and injected with Exparel. Wounds were all injected with Exparel and closed with staples. Patient was awakened and returned to the recovery room in satisfactory condition.  Matt B. Daphine Deutscher, MD, [facility], [place] [phone]

## 2011-09-13 NOTE — Progress Notes (Signed)
Pt had clear liquids 09/12/11 and took laxatives as ordered

## 2011-09-13 NOTE — Anesthesia Preprocedure Evaluation (Signed)
Anesthesia Evaluation  Patient identified by MRN, date of birth, ID band Patient awake    Reviewed: Allergy & Precautions, H&P , NPO status , Patient's Chart, lab work & pertinent test results, reviewed documented beta blocker date and time   Airway Mallampati: II TM Distance: >3 FB Neck ROM: Full    Dental  (+) Teeth Intact, Poor Dentition and Dental Advisory Given   Pulmonary neg pulmonary ROS,  breath sounds clear to auscultation+ rhonchi         Cardiovascular hypertension, Pt. on medications Rhythm:Regular Rate:Normal  Denies cardiac symptoms   Neuro/Psych negative neurological ROS  negative psych ROS   GI/Hepatic Neg liver ROS, Colon cancer   Endo/Other  negative endocrine ROS  Renal/GU negative Renal ROS  negative genitourinary   Musculoskeletal negative musculoskeletal ROS (+)   Abdominal   Peds negative pediatric ROS (+)  Hematology negative hematology ROS (+) Anemia, Hgb 10.5   Anesthesia Other Findings   Reproductive/Obstetrics negative OB ROS                           Anesthesia Physical Anesthesia Plan  ASA: III  Anesthesia Plan: General   Post-op Pain Management:    Induction: Intravenous  Airway Management Planned: Oral ETT  Additional Equipment:   Intra-op Plan:   Post-operative Plan: Extubation in OR  Informed Consent: I have reviewed the patients History and Physical, chart, labs and discussed the procedure including the risks, benefits and alternatives for the proposed anesthesia with the patient or authorized representative who has indicated his/her understanding and acceptance.   Dental advisory given  Plan Discussed with: CRNA and Surgeon  Anesthesia Plan Comments:         Anesthesia Quick Evaluation

## 2011-09-14 LAB — COMPREHENSIVE METABOLIC PANEL
Albumin: 3 g/dL — ABNORMAL LOW (ref 3.5–5.2)
Alkaline Phosphatase: 68 U/L (ref 39–117)
BUN: 11 mg/dL (ref 6–23)
Calcium: 9 mg/dL (ref 8.4–10.5)
Creatinine, Ser: 0.67 mg/dL (ref 0.50–1.10)
GFR calc Af Amer: >90 mL/min (ref >90)
Potassium: 3.8 mEq/L (ref 3.5–5.1)
Total Protein: 6.3 g/dL (ref 6.0–8.3)

## 2011-09-14 LAB — CBC
HCT: 30 % — ABNORMAL LOW (ref 36.0–46.0)
MCH: 28.5 pg (ref 26.0–34.0)
MCHC: 33.7 g/dL (ref 30.0–36.0)
RDW: 15.4 % (ref 11.5–15.5)

## 2011-09-14 MED ORDER — CAPTOPRIL 25 MG PO TABS
25.0000 mg | ORAL_TABLET | Freq: Three times a day (TID) | ORAL | Status: DC
  Administered 2011-09-14 – 2011-09-19 (×16): 25 mg via ORAL
  Filled 2011-09-14 (×20): qty 1

## 2011-09-14 MED ORDER — LOSARTAN POTASSIUM 50 MG PO TABS
50.0000 mg | ORAL_TABLET | Freq: Every day | ORAL | Status: DC
  Administered 2011-09-14 – 2011-09-19 (×7): 50 mg via ORAL
  Filled 2011-09-14 (×7): qty 1

## 2011-09-14 MED ORDER — HYDROCHLOROTHIAZIDE 12.5 MG PO CAPS
12.5000 mg | ORAL_CAPSULE | Freq: Every day | ORAL | Status: DC
  Administered 2011-09-14 – 2011-09-19 (×7): 12.5 mg via ORAL
  Filled 2011-09-14 (×7): qty 1

## 2011-09-14 NOTE — Progress Notes (Signed)
Utilization review completed.  

## 2011-09-14 NOTE — Progress Notes (Signed)
Patient ID: Vicki Woods, female   DOB: Jul 06, 1931, 78 y.o.   MRN: [id number] [facility] Progress Note:   1 Day Post-Op  Subjective: Mental status is clear Objective: Vital signs in last 24 hours: Temp:  [97.2 F (36.2 C)-98.6 F (37 C)] 98.6 F (37 C) (06/27 0400) Pulse Rate:  [54-92] 67  (06/27 0803) Resp:  [12-20] 13  (06/27 0803) BP: (138-191)/(51-99) 163/67 mmHg (06/27 0803) SpO2:  [98 %-100 %] 100 % (06/27 0803) Weight:  [192 lb 3.9 oz (87.2 kg)-194 lb 3.6 oz (88.1 kg)] 194 lb 3.6 oz (88.1 kg) (06/27 0000)  Intake/Output from previous day: 06/26 0701 - 06/27 0700 In: 3675 [I.V.:3475; IV Piggyback:200] Out: 2275 [Urine:2225; Blood:50] Intake/Output this shift: Total I/O In: 100 [I.V.:100] Out: -   Physical Exam: Work of breathing is  Not labored.  Minimal abdominal pain.  Exparel injected into fascial closure  Lab Results:  Results for orders placed during the hospital encounter of 09/13/11 (from the past 48 hour(s))  TYPE AND SCREEN     Status: Normal (Preliminary result)   Collection Time   09/13/11 10:30 AM      Component Value Range Comment   ABO/RH(D) O POS      Antibody Screen POS      Sample Expiration 09/16/2011      DAT, IgG NEG      Antibody Identification ANTI-Fya (DUFFY a) ANTI-K      Unit Number [id number]      Blood Component Type RBC LR PHER2      Unit division 00      Status of Unit ALLOCATED      Donor AG Type        Value: NEGATIVE FOR DUFFY A ANTIGEN NEGATIVE FOR KELL ANTIGEN   Transfusion Status OK TO TRANSFUSE      Crossmatch Result COMPATIBLE      Unit Number [id number]      Blood Component Type RED CELLS,LR      Unit division 00      Status of Unit ALLOCATED      Donor AG Type        Value: NEGATIVE FOR DUFFY A ANTIGEN NEGATIVE FOR KELL ANTIGEN   Transfusion Status OK TO TRANSFUSE      Crossmatch Result COMPATIBLE      Unit Number [id number]      Blood Component Type RED CELLS,LR      Unit division 00      Status of  Unit ALLOCATED      Donor AG Type        Value: NEGATIVE FOR DUFFY A ANTIGEN NEGATIVE FOR KELL ANTIGEN   Transfusion Status OK TO TRANSFUSE      Crossmatch Result COMPATIBLE     MRSA PCR SCREENING     Status: Normal   Collection Time   09/13/11  4:52 PM      Component Value Range Comment   MRSA by PCR NEGATIVE  NEGATIVE   CBC     Status: Abnormal   Collection Time   09/13/11  5:43 PM      Component Value Range Comment   WBC 10.5  4.0 - 10.5 K/uL    RBC 3.49 (*) 3.87 - 5.11 MIL/uL    Hemoglobin 9.9 (*) 12.0 - 15.0 g/dL    HCT 62.9 (*) 52.8 - 46.0 %    MCV 86.2  78.0 - 100.0 fL    MCH 28.4  26.0 - 34.0 pg  MCHC 32.9  30.0 - 36.0 g/dL    RDW 78.2 (*) 95.6 - 15.5 %    Platelets 242  150 - 400 K/uL   CREATININE, SERUM     Status: Abnormal   Collection Time   09/13/11  5:43 PM      Component Value Range Comment   Creatinine, Ser 0.80  0.50 - 1.10 mg/dL    GFR calc non Af Amer 69 (*) >90 mL/min    GFR calc Af Amer 80 (*) >90 mL/min   CBC     Status: Abnormal   Collection Time   09/14/11  3:15 AM      Component Value Range Comment   WBC 10.0  4.0 - 10.5 K/uL    RBC 3.54 (*) 3.87 - 5.11 MIL/uL    Hemoglobin 10.1 (*) 12.0 - 15.0 g/dL    HCT 21.3 (*) 08.6 - 46.0 %    MCV 84.7  78.0 - 100.0 fL    MCH 28.5  26.0 - 34.0 pg    MCHC 33.7  30.0 - 36.0 g/dL    RDW 57.8  46.9 - 62.9 %    Platelets 266  150 - 400 K/uL   COMPREHENSIVE METABOLIC PANEL     Status: Abnormal   Collection Time   09/14/11  3:15 AM      Component Value Range Comment   Sodium 133 (*) 135 - 145 mEq/L    Potassium 3.8  3.5 - 5.1 mEq/L    Chloride 101  96 - 112 mEq/L    CO2 22  19 - 32 mEq/L    Glucose, Bld 144 (*) 70 - 99 mg/dL    BUN 11  6 - 23 mg/dL    Creatinine, Ser 5.28  0.50 - 1.10 mg/dL    Calcium 9.0  8.4 - 41.3 mg/dL    Total Protein 6.3  6.0 - 8.3 g/dL    Albumin 3.0 (*) 3.5 - 5.2 g/dL    AST 16  0 - 37 U/L    ALT 8  0 - 35 U/L    Alkaline Phosphatase 68  39 - 117 U/L    Total Bilirubin 0.3  0.3 -  1.2 mg/dL    GFR calc non Af Amer 82 (*) >90 mL/min    GFR calc Af Amer >90  >90 mL/min     Radiology/Results: No results found.  Anti-infectives: Anti-infectives     Start     Dose/Rate Route Frequency Ordered Stop   09/13/11 0942   cefOXitin (MEFOXIN) 1 g in dextrose 5 % 50 mL IVPB  Status:  Discontinued        1 g 100 mL/hr over 30 Minutes Intravenous 60 min pre-op 09/13/11 2440 09/13/11 1303          Assessment/Plan: Problem List: Patient Active Problem List  Diagnosis  . HYPERTENSION  . MASS, LOCALIZED, SUPERFICIAL  . CARDIAC MURMUR  . CHEST PAIN  . HEARTBURN  . GI bleed  . Anemia  . Obesity  . Hypokalemia  . Cancer of colon    Doing well.  Will transfer from stepdown to 5West 1 Day Post-Op    LOS: 1 day   Matt B. Daphine Deutscher, MD, [facility] [phone] beeper [phone]  09/14/2011 8:58 AM

## 2011-09-15 MED ORDER — ONDANSETRON HCL 4 MG/2ML IJ SOLN
INTRAMUSCULAR | Status: AC
  Filled 2011-09-15: qty 2

## 2011-09-15 MED ORDER — ONDANSETRON HCL 4 MG/2ML IJ SOLN
4.0000 mg | INTRAMUSCULAR | Status: DC | PRN
  Administered 2011-09-15 – 2011-09-19 (×5): 4 mg via INTRAVENOUS
  Filled 2011-09-15 (×5): qty 2

## 2011-09-15 NOTE — Progress Notes (Signed)
Patient ID: Vicki Woods, female   DOB: 05/16/1931, 78 y.o.   MRN: [id number] [facility] Progress Note:   2 Days Post-Op  Subjective: Mental status is alert.  No complaints.  Passed gas this morning Objective: Vital signs in last 24 hours: Temp:  [97.5 F (36.4 C)-99.6 F (37.6 C)] 98.7 F (37.1 C) (06/28 0535) Pulse Rate:  [62-74] 74  (06/28 0535) Resp:  [14-20] 18  (06/28 0535) BP: (148-184)/(58-78) 168/71 mmHg (06/28 0535) SpO2:  [97 %-99 %] 98 % (06/28 0535)  Intake/Output from previous day: 06/27 0701 - 06/28 0700 In: 1500 [P.O.:100; I.V.:1300; IV Piggyback:100] Out: 2350 [Urine:2350] Intake/Output this shift:    Physical Exam: Work of breathing is  Normal.  Pain well controlled.    Lab Results:  Results for orders placed during the hospital encounter of 09/13/11 (from the past 48 hour(s))  TYPE AND SCREEN     Status: Normal (Preliminary result)   Collection Time   09/13/11 10:30 AM      Component Value Range Comment   ABO/RH(D) O POS      Antibody Screen POS      Sample Expiration 09/16/2011      DAT, IgG NEG      Antibody Identification ANTI-Fya (DUFFY a) ANTI-K      Unit Number [id number]      Blood Component Type RBC LR PHER2      Unit division 00      Status of Unit ALLOCATED      Donor AG Type        Value: NEGATIVE FOR DUFFY A ANTIGEN NEGATIVE FOR KELL ANTIGEN   Transfusion Status OK TO TRANSFUSE      Crossmatch Result COMPATIBLE      Unit Number [id number]      Blood Component Type RED CELLS,LR      Unit division 00      Status of Unit ALLOCATED      Donor AG Type        Value: NEGATIVE FOR DUFFY A ANTIGEN NEGATIVE FOR KELL ANTIGEN   Transfusion Status OK TO TRANSFUSE      Crossmatch Result COMPATIBLE      Unit Number [id number]      Blood Component Type RED CELLS,LR      Unit division 00      Status of Unit ALLOCATED      Donor AG Type        Value: NEGATIVE FOR DUFFY A ANTIGEN NEGATIVE FOR KELL ANTIGEN   Transfusion Status OK TO  TRANSFUSE      Crossmatch Result COMPATIBLE     MRSA PCR SCREENING     Status: Normal   Collection Time   09/13/11  4:52 PM      Component Value Range Comment   MRSA by PCR NEGATIVE  NEGATIVE   CBC     Status: Abnormal   Collection Time   09/13/11  5:43 PM      Component Value Range Comment   WBC 10.5  4.0 - 10.5 K/uL    RBC 3.49 (*) 3.87 - 5.11 MIL/uL    Hemoglobin 9.9 (*) 12.0 - 15.0 g/dL    HCT 62.9 (*) 52.8 - 46.0 %    MCV 86.2  78.0 - 100.0 fL    MCH 28.4  26.0 - 34.0 pg    MCHC 32.9  30.0 - 36.0 g/dL    RDW 41.3 (*) 24.4 - 15.5 %    Platelets 242  150 - 400 K/uL   CREATININE, SERUM     Status: Abnormal   Collection Time   09/13/11  5:43 PM      Component Value Range Comment   Creatinine, Ser 0.80  0.50 - 1.10 mg/dL    GFR calc non Af Amer 69 (*) >90 mL/min    GFR calc Af Amer 80 (*) >90 mL/min   CBC     Status: Abnormal   Collection Time   09/14/11  3:15 AM      Component Value Range Comment   WBC 10.0  4.0 - 10.5 K/uL    RBC 3.54 (*) 3.87 - 5.11 MIL/uL    Hemoglobin 10.1 (*) 12.0 - 15.0 g/dL    HCT 16.1 (*) 09.6 - 46.0 %    MCV 84.7  78.0 - 100.0 fL    MCH 28.5  26.0 - 34.0 pg    MCHC 33.7  30.0 - 36.0 g/dL    RDW 04.5  40.9 - 81.1 %    Platelets 266  150 - 400 K/uL   COMPREHENSIVE METABOLIC PANEL     Status: Abnormal   Collection Time   09/14/11  3:15 AM      Component Value Range Comment   Sodium 133 (*) 135 - 145 mEq/L    Potassium 3.8  3.5 - 5.1 mEq/L    Chloride 101  96 - 112 mEq/L    CO2 22  19 - 32 mEq/L    Glucose, Bld 144 (*) 70 - 99 mg/dL    BUN 11  6 - 23 mg/dL    Creatinine, Ser 9.14  0.50 - 1.10 mg/dL    Calcium 9.0  8.4 - 78.2 mg/dL    Total Protein 6.3  6.0 - 8.3 g/dL    Albumin 3.0 (*) 3.5 - 5.2 g/dL    AST 16  0 - 37 U/L    ALT 8  0 - 35 U/L    Alkaline Phosphatase 68  39 - 117 U/L    Total Bilirubin 0.3  0.3 - 1.2 mg/dL    GFR calc non Af Amer 82 (*) >90 mL/min    GFR calc Af Amer >90  >90 mL/min     Radiology/Results: No results  found.  Anti-infectives: Anti-infectives     Start     Dose/Rate Route Frequency Ordered Stop   09/13/11 0942   cefOXitin (MEFOXIN) 1 g in dextrose 5 % 50 mL IVPB  Status:  Discontinued        1 g 100 mL/hr over 30 Minutes Intravenous 60 min pre-op 09/13/11 9562 09/13/11 1303          Assessment/Plan: Problem List: Patient Active Problem List  Diagnosis  . HYPERTENSION  . MASS, LOCALIZED, SUPERFICIAL  . CARDIAC MURMUR  . CHEST PAIN  . HEARTBURN  . GI bleed  . Anemia  . Obesity  . Hypokalemia  . Cancer of colon    Path not back.  Post right hemicolectomy.  Will offer clears.   2 Days Post-Op    LOS: 2 days   Matt B. Daphine Deutscher, MD, [facility] [phone] beeper [phone]  09/15/2011 8:45 AM

## 2011-09-16 NOTE — Progress Notes (Signed)
Patient ID: Vicki Woods, female   DOB: [birth year], 78 y.o.   MRN: [id number]  General Surgery - [facility] - Progress Note  POD# 3  Subjective: Patient up to bathroom - loose BM's.  Ambulatory.  Some nausea yesterday with CL diet.  Objective: Vital signs in last 24 hours: Temp:  [98 F (36.7 C)-99 F (37.2 C)] 98 F (36.7 C) (06/29 0515) Pulse Rate:  [70-84] 78  (06/29 0515) Resp:  [16-18] 16  (06/29 0515) BP: (121-190)/(60-83) 154/60 mmHg (06/29 0515) SpO2:  [95 %-100 %] 95 % (06/29 0515) Last BM Date: 09/12/11  Intake/Output from previous day: 06/28 0701 - 06/29 0700 In: 2669.6 [I.V.:2669.6] Out: 960 [Urine:850; Emesis/NG output:110]  Exam: HEENT - clear, not icteric Neck - soft Chest - clear bilaterally Cor - RRR, no murmur Abd - soft, mild distension; BS present; dressings dry and intact Ext - no significant edema Neuro - grossly intact, no focal deficits  Lab Results:   Basename 09/14/11 0315 09/13/11 1743  WBC 10.0 10.5  HGB 10.1* 9.9*  HCT 30.0* 30.1*  PLT 266 242     Basename 09/14/11 0315 09/13/11 1743  NA 133* --  K 3.8 --  CL 101 --  CO2 22 --  GLUCOSE 144* --  BUN 11 --  CREATININE 0.67 0.80  CALCIUM 9.0 --    Studies/Results: No results found.  Assessment / Plan: 1.  Status post right colectomy  - continue clear liquid diet today  - OOB, ambulate  - IVF  Velora Heckler, MD, [facility] Office: [phone]  09/16/2011

## 2011-09-17 LAB — TYPE AND SCREEN
Donor AG Type: NEGATIVE
Donor AG Type: NEGATIVE
Unit division: 0

## 2011-09-17 LAB — OCCULT BLOOD X 1 CARD TO LAB, STOOL: Fecal Occult Bld: POSITIVE

## 2011-09-17 NOTE — Progress Notes (Signed)
Patient ID: Vicki Woods, female   DOB: 1932-03-09, 78 y.o.   MRN: [id number]  General Surgery - [facility] - Progress Note  POD# 4  Subjective: Patient tolerating liquids.  Ambulated in halls yesterday.  Patient notes small blood in loose BM's overnight.  No complaints of pain.  Objective: Vital signs in last 24 hours: Temp:  [97.8 F (36.6 C)-98.8 F (37.1 C)] 98.8 F (37.1 C) (06/30 0651) Pulse Rate:  [70-78] 78  (06/30 0651) Resp:  [18] 18  (06/30 0651) BP: (130-168)/(59-77) 168/77 mmHg (06/30 0651) SpO2:  [98 %-100 %] 98 % (06/30 0651) Last BM Date: 09/12/11  Intake/Output from previous day: 06/29 0701 - 06/30 0700 In: 2107.5 [P.O.:240; I.V.:1867.5] Out: -   Exam: HEENT - clear, not icteric Neck - soft Chest - clear bilaterally Cor - RRR, no murmur Abd - soft, dressings dry and intact; BS active Ext - no significant edema Neuro - grossly intact, no focal deficits  Lab Results:  No results found for this basename: WBC:2,HGB:2,HCT:2,PLT:2 in the last 72 hours  No results found for this basename: NA:2,K:2,CL:2,CO2:2,GLUCOSE:2,BUN:2,CREATININE:2,CALCIUM:2 in the last 72 hours  Studies/Results: No results found.  Assessment / Plan: 1.  Status post right colectomy  - advance to regular diet today  - OOB, ambulate  - check stools and CBC in AM 7/1  Velora Heckler, MD, [facility] Office: [phone]  09/17/2011

## 2011-09-18 LAB — CBC
HCT: 29.3 % — ABNORMAL LOW (ref 36.0–46.0)
Hemoglobin: 9.7 g/dL — ABNORMAL LOW (ref 12.0–15.0)
RBC: 3.44 MIL/uL — ABNORMAL LOW (ref 3.87–5.11)
WBC: 8.7 10*3/uL (ref 4.0–10.5)

## 2011-09-18 NOTE — Progress Notes (Signed)
Clinical Social Work Department BRIEF PSYCHOSOCIAL ASSESSMENT 09/18/2011  Patient:  Vicki Woods, Vicki Woods     Account Number:  [id number]     Admit date:  09/13/2011  Clinical Social Worker:  Candie Chroman  Date/Time:  09/18/2011 01:03 PM  Referred by:  CSW  Date Referred:  09/18/2011 Referred for  SNF Placement   Other Referral:   Interview type:  Patient Other interview type:    PSYCHOSOCIAL DATA Living Status:  ALONE Admitted from facility:   Level of care:   Primary support name:  Vicki Woods Primary support relationship to patient:  FRIEND Degree of support available:   supportive    CURRENT CONCERNS Current Concerns  Post-Acute Placement   Other Concerns:    SOCIAL WORK ASSESSMENT / PLAN Pt is a 77 yr old female living at home prior to hospitalization. CSW met with pt to assist with d/c planning. Pt requests ST SNF placement at [facility] following hospitalization. SNF contacted and will have an opening for pt upon d/c. CSW will follow to assist with d/c planning to SNF.   Assessment/plan status:  Psychosocial Support/Ongoing Assessment of Needs Other assessment/ plan:   Information/referral to community resources:   SNF list. Explained medicare coverage in hospital/SNF.    PATIENT'S/FAMILY'S RESPONSE TO PLAN OF CARE: Pt is looking forward to SNF placement at [facility]    Cori Razor LCSW [phone]

## 2011-09-18 NOTE — Progress Notes (Signed)
Clinical Social Work Department CLINICAL SOCIAL WORK PLACEMENT NOTE 09/18/2011  Patient:  Vicki Woods, Vicki Woods  Account Number:  [id number] Admit date:  09/13/2011  Clinical Social Worker:  Cori Razor, LCSW  Date/time:  09/18/2011 01:09 PM  Clinical Social Work is seeking post-discharge placement for this patient at the following level of care:   SKILLED NURSING   (*CSW will update this form in Epic as items are completed)   09/18/2011  Patient/family provided with [facility] Department of Clinical Social Work's list of facilities offering this level of care within the geographic area requested by the patient (or if unable, by the patient's family).  09/18/2011  Patient/family informed of their freedom to choose among providers that offer the needed level of care, that participate in Medicare, Medicaid or managed care program needed by the patient, have an available bed and are willing to accept the patient.  09/18/2011  Patient/family informed of [facility]' ownership interest in [facility], as well as of the fact that they are under no obligation to receive care at this facility.  PASARR submitted to EDS on 09/18/2011 PASARR number received from [employer] on 09/18/2011  FL2 transmitted to all facilities in geographic area requested by pt/family on  09/18/2011 FL2 transmitted to all facilities within larger geographic area on   Patient informed that his/her managed care company has contracts with or will negotiate with  certain facilities, including the following:     Patient/family informed of bed offers received:   Patient chooses bed at [facility] Physician recommends and patient chooses bed at    Patient to be transferred to  on   Patient to be transferred to facility by   The following physician request were entered in Epic:   Additional Comments:  Cori Razor LCSW [phone]

## 2011-09-18 NOTE — Progress Notes (Signed)
Orders received from MD Daphine Deutscher to enter orders for a skilled nursing facilty, orders entered Means, Dynasty Holquin N RN 09-18-11 12:23pm

## 2011-09-18 NOTE — Progress Notes (Signed)
Patient ID: Vicki Woods, female   DOB: 07-07-1931, 78 y.o.   MRN: [id number] [facility] Progress Note:   5 Days Post-Op  Subjective: Mental status is clear Objective: Vital signs in last 24 hours: Temp:  [98.1 F (36.7 C)-98.5 F (36.9 C)] 98.5 F (36.9 C) (07/01 0600) Pulse Rate:  [62-71] 71  (07/01 0600) Resp:  [16-18] 18  (07/01 0600) BP: (119-141)/(70-80) 121/73 mmHg (07/01 0600) SpO2:  [98 %-99 %] 99 % (07/01 0600)  Intake/Output from previous day: 06/30 0701 - 07/01 0700 In: 1497.5 [P.O.:240; I.V.:1257.5] Out: -  Intake/Output this shift: Total I/O In: 80 [P.O.:80] Out: 100 [Urine:100]  Physical Exam: Work of breathing is  Normal.  Minimal pain.  May need to go to SNF post discharge  Lab Results:  Results for orders placed during the hospital encounter of 09/13/11 (from the past 48 hour(s))  OCCULT BLOOD X 1 CARD TO LAB, STOOL     Status: Normal   Collection Time   09/17/11 12:56 PM      Component Value Range Comment   Fecal Occult Bld POSITIVE     CBC     Status: Abnormal   Collection Time   09/18/11  4:14 AM      Component Value Range Comment   WBC 8.7  4.0 - 10.5 K/uL    RBC 3.44 (*) 3.87 - 5.11 MIL/uL    Hemoglobin 9.7 (*) 12.0 - 15.0 g/dL    HCT 86.5 (*) 78.4 - 46.0 %    MCV 85.2  78.0 - 100.0 fL    MCH 28.2  26.0 - 34.0 pg    MCHC 33.1  30.0 - 36.0 g/dL    RDW 69.6 (*) 29.5 - 15.5 %    Platelets 298  150 - 400 K/uL     Radiology/Results: No results found.  Anti-infectives: Anti-infectives     Start     Dose/Rate Route Frequency Ordered Stop   09/13/11 0942   cefOXitin (MEFOXIN) 1 g in dextrose 5 % 50 mL IVPB  Status:  Discontinued        1 g 100 mL/hr over 30 Minutes Intravenous 60 min pre-op 09/13/11 2841 09/13/11 1303          Assessment/Plan: Problem List: Patient Active Problem List  Diagnosis  . HYPERTENSION  . MASS, LOCALIZED, SUPERFICIAL  . CARDIAC MURMUR  . CHEST PAIN  . HEARTBURN  . GI bleed  . Anemia  .  Obesity  . Hypokalemia  . Cancer of colon    Path negative nodes (16).  Doing well.  5 Days Post-Op    LOS: 5 days   Matt B. Daphine Deutscher, MD, [facility] [phone] beeper [phone]  09/18/2011 10:33 AM

## 2011-09-19 ENCOUNTER — Inpatient Hospital Stay
Admission: RE | Admit: 2011-09-19 | Discharge: 2011-10-17 | Disposition: A | Discharge status: Home / Self Care | Payer: PRIVATE HEALTH INSURANCE | Source: Ambulatory Visit | Attending: Internal Medicine | Admitting: Internal Medicine

## 2011-09-19 MED ORDER — OXYCODONE-ACETAMINOPHEN 5-325 MG PO TABS: 1.0000 | ORAL_TABLET | ORAL | Status: AC | PRN

## 2011-09-19 NOTE — Discharge Summary (Signed)
Physician Discharge Summary  Patient ID: DONNAMARIA SHANDS MRN: [id number] DOB/AGE: January 23, 1932 78 y.o.  Admit date: 09/13/2011 Discharge date: 09/19/2011  Admission Diagnoses:  Colon cancer   Discharge Diagnoses:  Cancer of the hepatic flexure (T3) with node negative Active Problems:  Cancer of colon   Surgery:  Lap assisted right hemicolectomy  Discharged Condition: improved  Hospital Course:   Had surgery.  Diet slowly advanced.  Incision OK.  Transferred to nursing home in [employer]  Consults: none  Significant Diagnostic Studies: none    Discharge Exam: Blood pressure 125/71, pulse 68, temperature 98 F (36.7 C), temperature source Oral, resp. rate 18, height 5\' 1"  (1.549 m), weight 194 lb 3.6 oz (88.1 kg), SpO2 98.00%. Incision bland.  Staples removed.    Disposition: 01-Home or Self Care  Discharge Orders    Future Orders Please Complete By Expires   Diet - low sodium heart healthy      Discharge instructions      Comments:   May shower and shampoo Walk ad lib   May shower / Bathe      Activity as tolerated - No restrictions      No wound care      Call MD for:  redness, tenderness, or signs of infection (pain, swelling, redness, odor or green/yellow discharge around incision site)        Medication List  As of 09/19/2011  9:55 AM   TAKE these medications         acetaminophen 500 MG tablet   Commonly known as: TYLENOL   Take 500 mg by mouth every 4 (four) hours as needed. For pain      amLODipine 10 MG tablet   Commonly known as: NORVASC   Take 2.5 mg by mouth daily with breakfast. Patient takes at various times of day depending on blood pressure      captopril 50 MG tablet   Commonly known as: CAPOTEN   Take 25 mg by mouth 3 (three) times daily.      furosemide 20 MG tablet   Commonly known as: LASIX   Take 20 mg by mouth daily as needed. Leg swelling      LIQUID TEARS 1.4 % ophthalmic solution   Generic drug: polyvinyl alcohol   Place 2 drops  into both eyes daily as needed. For dry eyes      losartan-hydrochlorothiazide 100-25 MG per tablet   Commonly known as: HYZAAR   Take 0.5 tablets by mouth daily. Patient takes around 1100am or 1200noon      oxyCODONE-acetaminophen 5-325 MG per tablet   Commonly known as: PERCOCET   Take 1 tablet by mouth every 4 (four) hours as needed.           Follow-up Information    Follow up with Lauran Romanski B, MD in 6 weeks.   Contact information:   [employer] [address], [address] [place] [address] [phone]          Signed: Valarie Merino 09/19/2011, 9:55 AM

## 2011-09-19 NOTE — Progress Notes (Signed)
Patient is alert and oriented, vital signs are stable, to be discharged to [facility], iv removed and cath intact, staples removed as ordered and steri-strips applied as ordered, incisions are within normal limits, will continue to monitor Means, Rhylie Stehr N RN 13:00pm 09-19-11

## 2011-09-19 NOTE — Progress Notes (Signed)
FL2 in shadow chart for MD signature. Pt has SNF bed at [facility] today if she is ready for D/C. CSW will follow to assist with d/c planning to SNF.  Cori Razor LCSW [phone]

## 2011-09-19 NOTE — Discharge Instructions (Signed)
Cancer of the Colon, Treatment by Resection You and your caregiver have decided that surgical removal of your colon cancer is the best form of treatment for you. Your surgeon or surgeons will do their best to remove your entire tumor. To do this, some normal tissue must also be removed to give you the best chance for a cure. The following will help describe what happens when you have this surgery. TREATMENT  Surgery is the most common treatment for colorectal cancer. It is a type of local therapy. It treats the cancer in the colon or rectum and the area close to the tumor by removing the tumor and some of the healthy tissue around it. For larger cancers, your surgeon must make an cut (incision) into the belly (abdomen) so he or she can see the area of the tumor and remove it as well as part of the healthy colon or rectum. Some nearby lymph nodes also may be removed. The surgeon checks the rest of the abdomen, the intestine and the liver to see if the cancer has spread. When a section of the colon or rectum is removed, the surgeon can usually reconnect the healthy parts. However, sometimes reconnection is not possible. In this case, the surgeon creates a new path for waste to leave the body. The surgeon makes an opening (a stoma) in the wall of the abdomen. The upper end of the intestine is then connected to the stoma. The other end is closed. The operation to create the stoma is called a colostomy. A flat bag fits over the stoma to collect waste, and a special adhesive holds it in place.  Some colostomies are temporary. The colostomy is needed only until the colon or rectum heals from surgery. After healing takes place, the surgeon reconnects the parts of the intestine and closes the stoma. Other patients need a permanent colostomy.  ASK YOUR CAREGIVER THESE QUESTIONS BEFORE HAVING SURGERY:  What kind of operation do you recommend for me?   Do I need any lymph nodes removed? Will other tissues be removed?  Why?   What are the risks of surgery? Will I have any lasting side effects?   Will I need a colostomy? If so, will it be permanent?   How will I feel after the operation?   If I have pain, how will it be controlled?   How long will I be in the hospital?   When can I get back to my normal activities?  FOLLOW-UP CARE  Follow-up care after treatment for colorectal cancer is important. Even when the cancer seems to have been completely removed or destroyed, the disease sometimes returns. Undetected cancer cells may still remain somewhere in the body after treatment. The doctor keeps checking the person's recovery and checks for recurrence of the cancer. Recurrence means that the cancer comes back.  Checkups help make sure that changes in health are found. Checkups may include:  A physical exam (including a digital rectal exam). This means your caregiver checks you to see if there are any abnormal changes they can see or feel.   Lab tests (including fecal occult blood test and CEA test) may be done. The "fecal occult blood test" checks for blood in the stool. The CEA (carcinoembryonic antigen) is a blood test that looks for a marker of colon cancer in the blood.   A colonoscopy is a test where your caregiver examines your colon with a flexible instrument like a thin telescope which looks at the inside   of the large bowel.   Other specialized x-rays, CT scans, or other tests may be performed.  Between scheduled visits you should contact your caregivers as soon as any health problems appear. Document Released: 03/09/2003 Document Revised: 02/23/2011 Document Reviewed: 07/02/2007 [facility] Patient Information 2012 [place], [place].

## 2011-09-20 NOTE — Progress Notes (Signed)
Clinical Social Work Department CLINICAL SOCIAL WORK PLACEMENT NOTE 09/20/2011  Patient:  Vicki Woods, Vicki Woods  Account Number:  [id number] Admit date:  09/13/2011  Clinical Social Worker:  Cori Razor, LCSW  Date/time:  09/18/2011 01:09 PM  Clinical Social Work is seeking post-discharge placement for this patient at the following level of care:   SKILLED NURSING   (*CSW will update this form in Epic as items are completed)   09/18/2011  Patient/family provided with [facility] Department of Clinical Social Work's list of facilities offering this level of care within the geographic area requested by the patient (or if unable, by the patient's family).  09/18/2011  Patient/family informed of their freedom to choose among providers that offer the needed level of care, that participate in Medicare, Medicaid or managed care program needed by the patient, have an available bed and are willing to accept the patient.  09/18/2011  Patient/family informed of [facility]' ownership interest in [facility], as well as of the fact that they are under no obligation to receive care at this facility.  PASARR submitted to EDS on 09/18/2011 PASARR number received from EDS on 09/18/2011  FL2 transmitted to all facilities in geographic area requested by pt/family on  09/18/2011 FL2 transmitted to all facilities within larger geographic area on   Patient informed that his/her managed care company has contracts with or will negotiate with  certain facilities, including the following:     Patient/family informed of bed offers received:   Patient chooses bed at [facility] Physician recommends and patient chooses bed at    Patient to be transferred to [facility] on  09/19/2011 Patient to be transferred to facility by [facility]  The following physician request were entered in Epic:   Additional Comments:  Cori Razor LCSW [phone]

## 2011-11-09 ENCOUNTER — Encounter (INDEPENDENT_AMBULATORY_CARE_PROVIDER_SITE_OTHER): Payer: Self-pay | Admitting: Surgery

## 2011-11-09 ENCOUNTER — Telehealth (HOSPITAL_COMMUNITY): Payer: Self-pay | Admitting: Oncology

## 2011-11-09 ENCOUNTER — Ambulatory Visit (INDEPENDENT_AMBULATORY_CARE_PROVIDER_SITE_OTHER): Payer: Medicare Other | Admitting: Surgery

## 2011-11-09 VITALS — BP 178/84 | HR 68 | Temp 97.6°F | Ht 61.0 in | Wt 181.2 lb

## 2011-11-09 DIAGNOSIS — C189 Malignant neoplasm of colon, unspecified: Secondary | ICD-10-CM

## 2011-11-09 NOTE — Progress Notes (Signed)
Vicki Woods 78 y.o.  Body mass index is 34.24 kg/(m^2).  Patient Active Problem List  Diagnosis  . HYPERTENSION  . MASS, LOCALIZED, SUPERFICIAL  . CARDIAC MURMUR  . CHEST PAIN  . HEARTBURN  . GI bleed  . Anemia  . Obesity  . Hypokalemia  . Cancer of colon    Allergies  Allergen Reactions  . Chocolate Shortness Of Breath and Swelling  . Honey Shortness Of Breath  . Penicillins Anaphylaxis  . Sulfonamide Derivatives Anaphylaxis  . Aspirin     Former doctor told patient to stop due to allergy shots she was receiving.  . Codeine Anxiety    Past Surgical History  Procedure Date  . Total abdominal hysterectomy w/ bilateral salpingoophorectomy   . Esophagogastroduodenoscopy 06/20/2011    Procedure: ESOPHAGOGASTRODUODENOSCOPY (EGD);  Surgeon: Malissa Hippo, MD;  [facility];  Service: Endoscopy;  Laterality: N/A;  . Colonoscopy 06/21/2011    Procedure: COLONOSCOPY;  Surgeon: Malissa Hippo, MD;  [facility];  Service: Endoscopy;  Laterality: N/A;  . Abdominal hysterectomy 1963   GOLDING,JOHN CABOT, MD No diagnosis found.  Office visit: [name] comes in today in followup after her right hemicolectomy. She had an ulcerated adenocarcinoma that was 4.5 cm in diameter with negative margins in 16 lymph nodes negative for tumor. She is 78 years old go along fairly well at the time of her surgery September 13, 2011. I discussed sending her to see medical oncology again and this time I think she is receptive to hearing if her a additional measures that they could prescribe. Her incision is healed nicely I will see her again in 3 months. Matt B. Daphine Deutscher, MD, [facility] [phone] beeper [phone]  11/09/2011 10:53 AM

## 2011-11-09 NOTE — Patient Instructions (Addendum)
See medical oncologists Followup with Dr. Daphine Deutscher in 3 months

## 2011-11-22 ENCOUNTER — Ambulatory Visit (HOSPITAL_COMMUNITY): Payer: Medicare Other | Admitting: Oncology

## 2011-11-22 ENCOUNTER — Encounter (HOSPITAL_COMMUNITY): Payer: Medicare Other | Attending: Oncology | Admitting: Oncology

## 2011-11-22 ENCOUNTER — Encounter (HOSPITAL_COMMUNITY): Payer: Self-pay | Admitting: Oncology

## 2011-11-22 VITALS — BP 197/70 | HR 90 | Temp 97.9°F | Resp 20 | Ht 59.0 in | Wt 186.4 lb

## 2011-11-22 DIAGNOSIS — I10 Essential (primary) hypertension: Secondary | ICD-10-CM | POA: Insufficient documentation

## 2011-11-22 DIAGNOSIS — C189 Malignant neoplasm of colon, unspecified: Secondary | ICD-10-CM | POA: Insufficient documentation

## 2011-11-22 DIAGNOSIS — R599 Enlarged lymph nodes, unspecified: Secondary | ICD-10-CM

## 2011-11-22 DIAGNOSIS — D649 Anemia, unspecified: Secondary | ICD-10-CM

## 2011-11-22 DIAGNOSIS — C183 Malignant neoplasm of hepatic flexure: Secondary | ICD-10-CM

## 2011-11-22 LAB — CBC WITH DIFFERENTIAL/PLATELET
Basophils Absolute: 0 10*3/uL (ref 0.0–0.1)
Basophils Relative: 0 % (ref 0–1)
Eosinophils Relative: 0 % (ref 0–5)
HCT: 32.4 % — ABNORMAL LOW (ref 36.0–46.0)
Hemoglobin: 10.7 g/dL — ABNORMAL LOW (ref 12.0–15.0)
MCHC: 33 g/dL (ref 30.0–36.0)
MCV: 86.9 fL (ref 78.0–100.0)
Monocytes Absolute: 0.2 10*3/uL (ref 0.1–1.0)
Monocytes Relative: 2 % — ABNORMAL LOW (ref 3–12)
Neutro Abs: 7.1 10*3/uL (ref 1.7–7.7)
RDW: 16.4 % — ABNORMAL HIGH (ref 11.5–15.5)

## 2011-11-22 LAB — COMPREHENSIVE METABOLIC PANEL
Alkaline Phosphatase: 81 U/L (ref 39–117)
BUN: 18 mg/dL (ref 6–23)
Chloride: 102 mEq/L (ref 96–112)
Creatinine, Ser: 0.82 mg/dL (ref 0.50–1.10)
GFR calc Af Amer: 77 mL/min — ABNORMAL LOW (ref >90)
Glucose, Bld: 103 mg/dL — ABNORMAL HIGH (ref 70–99)
Potassium: 3.6 mEq/L (ref 3.5–5.1)
Total Bilirubin: 0.4 mg/dL (ref 0.3–1.2)
Total Protein: 7.3 g/dL (ref 6.0–8.3)

## 2011-11-22 NOTE — Progress Notes (Signed)
Vicki Woods presented for labwork. Labs per MD order drawn via Peripheral Line 24 gauge needle inserted in lt ac  Good blood return present. Procedure without incident.  Needle removed intact. Patient tolerated procedure well.

## 2011-11-22 NOTE — Patient Instructions (Signed)
[facility]  Discharge Instructions Vicki Woods  DOB 1931/10/09 CSN [id number]  MRN [id number] Dr. Glenford Peers    RECOMMENDATIONS MADE BY THE CONSULTANT AND ANY TEST RESULTS WILL BE SENT TO YOUR REFERRING DOCTOR.   EXAM FINDINGS BY MD TODAY AND SIGNS AND SYMPTOMS TO REPORT TO CLINIC OR PRIMARY MD:   Labs every 3 months here at the [facility]  Return to see [place] in 1 year  Make sure you have your mammogram in January   I acknowledge that I have been informed and understand all the instructions given to me and received a copy. I do not have any more questions at this time, but understand that I may call the Specialty Clinic at [facility] at [phone] during business hours should I have any further questions or need assistance in obtaining follow-up care.    __________________________________________  _____________  __________ Signature of Patient or Authorized Representative            Date                   Time    __________________________________________ Nurse's Signature

## 2011-11-22 NOTE — Progress Notes (Signed)
Problem #1 stage II A. cancer of the colon status post right hemicolectomy 09/13/2011 by Dr. Daphine Deutscher. 16 nodes were negative no LV I was seen but she did have perineural space invasion found and a 4.5 cm tumor. This was a grade 1 tumor. 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 There is no family history of colon cancer that she is aware of. She has no children. Her husband is deceased. She is a nonsmoker and nondrinker.  Problem #2 hypertension on therapy This very pleasant lady was found by Dr. Phillips Odor and Rehman have anemia. In her workup she was sent multiple polyps in the colon and one was found to be involved adenocarcinoma. Her cancer when removed was ulcerated but grade 1. She is here today for an opinion concerning  chemotherapy. Her past medical history is positive for a hysterectomy that she felt was complete as she had hot flashes her only one week. She is also had chronic swelling of her legs for 10 years she states  She is also seeing Dr. Kristian Covey for evaluation of kidney function in the past though I do not have those notes.  She is a pleasant lady in no acute distress. Vital signs are recorded. She is overweight for her height at 195 pounds a 5 foot 0 inches frame. BMI is 37.7. Blood pressure today slightly elevated at 197/70 but she is somewhat anxious. Lymph node exam is negative throughout but she does have prominent fat pads in each axillary area. There no masses in either breast. Her lungs are clear. Her heart shows a regular rhythm and rate without murmur rub or gallop. Abdomen is mildly obese but without hepatosplenomegaly. Her incision is well-healed. She does have chronic lymphedema both legs. Her feet presently are not involved. Pulses in her feet are difficult to appreciate. Many teeth are missing. What you are left are in poor repair. Tongue is unremarkable. Pupils show cataract changes bilaterally but her pupils didn't respond to light. She is alert  and oriented.  She has stage IIA disease and needs observation only. She agrees to come back for a CEA every 3 months. I do think we're to get some baseline blood work including iron studies. She will see [place] once a year thereafter. She is agreeable to this plan.

## 2011-11-23 ENCOUNTER — Telehealth (INDEPENDENT_AMBULATORY_CARE_PROVIDER_SITE_OTHER): Payer: Self-pay | Admitting: General Surgery

## 2011-11-23 LAB — IRON AND TIBC
Iron: 50 ug/dL (ref 42–135)
TIBC: 305 ug/dL (ref 250–470)

## 2011-11-23 NOTE — Telephone Encounter (Signed)
Pt calling with problems with abdominal gas pains.  She asks if OK to take OTC remedies.  Advised okay to use these; also should try to walk as much as possible and examine possible food/ intake source of gas to eliminate it from her diet.

## 2012-01-17 ENCOUNTER — Telehealth (HOSPITAL_COMMUNITY): Payer: Self-pay | Admitting: *Deleted

## 2012-01-17 NOTE — Telephone Encounter (Signed)
Pt states that Dr. Phillips Odor is working her up for thyroid issues, worried about weight loss.Notified that  we can do labs sooner if she wants. We will do Friday AM

## 2012-01-19 ENCOUNTER — Encounter (HOSPITAL_COMMUNITY): Payer: Medicare Other | Attending: Oncology

## 2012-01-19 DIAGNOSIS — C183 Malignant neoplasm of hepatic flexure: Secondary | ICD-10-CM

## 2012-01-19 DIAGNOSIS — C189 Malignant neoplasm of colon, unspecified: Secondary | ICD-10-CM | POA: Insufficient documentation

## 2012-01-19 NOTE — Progress Notes (Signed)
Labs drawn today for cea 

## 2012-02-21 ENCOUNTER — Other Ambulatory Visit (HOSPITAL_COMMUNITY): Payer: Medicare Other

## 2012-02-29 ENCOUNTER — Encounter (HOSPITAL_COMMUNITY): Payer: Self-pay | Admitting: *Deleted

## 2012-02-29 ENCOUNTER — Telehealth (HOSPITAL_COMMUNITY): Payer: Self-pay | Admitting: Dietician

## 2012-02-29 NOTE — Telephone Encounter (Addendum)
.  Received referral via fax from [facility] for dx: "pt concerned about what type of diet she should be on for general health and cancer prevention". Per RN notes, pt family told her she should avoid all sugar.

## 2012-02-29 NOTE — Progress Notes (Signed)
Dietary referral made. Pt. Has questions about what diet she should be on since she has had colon cancer. Her family has told her she should cut out all sugars.

## 2012-03-04 NOTE — Telephone Encounter (Signed)
Spoke with patient over the phone for 20 minutes.  She reports she is very concerned about her health and wants to make sure she is following a proper diet to help with general health and cancer prevention.  She is very independent. She lives by herself, drives, and does all of her grocery shopping and housework. She has close family and friends in the area.  She reports that she is trying to eat more healthfully and that she is working at losing weight. She reports that in the past 3 years she has lost 83# intentionally, by diet modification and exercise. Her highest weight was 225#. She reports her lowest recent wt was around 175#. She has "gained some of the weight back" due to surgery and eating sweets. She tells me she eats a lot of red meat (but also likes chicken and fish) and buys mainly canned fruit canned in heavy syrup, except for bananas, which she buys fresh. She also reports eating several cookies daily.  She reports that her main issue is that she heard on the radio that eating sugar causes harm to cancer patients. Educated pt on principles of a general, healthful diet. Encouraged a plant based diet to promote cancer prevention. Discussed latest research from [employer] re: lifestyle modifications for cancer prevention including The New American Plate, regular physical activity, and maintaining a healthy weight and/ or trying to lose weight if overweight.  Discussed nutritional content of commonly eaten foods and suggested healthier alternatives. Educated pt on plate method and a general, healthful diet that includes low fat dairy, lean meats, whole fruits and vegetables, and whole grains most often. Discussed serving sizes and limiting red meats for less than 18 oz cooked per week. Also discussed choosing a variety of fruits and vegetables and choosing either fresh fruit or fruit canned in light syrup of its own juices. Also discussed importance of limiting foods high in sugar (ex. Sweets and sodas)  to help with weight loss efforts. Discussed importance of a healthy diet along with regular physical activity (at least 30 minutes 5 times per week) to achieve weight loss goals. Encouraged slow, moderate weight loss (0.5-2# weight loss per week) and adopting a healthy lifestyle vs achieving a certain weight or body type.  Pt requested to send additional information for her to review at her home. Mailed AND Nutrition Care Manual handout, "Cancer Prevention". Also provided New American Plate Diagram from [employer] and "Adopting A Plant Based Diet" handout to pt home via [place] Mail.

## 2012-03-28 ENCOUNTER — Ambulatory Visit (INDEPENDENT_AMBULATORY_CARE_PROVIDER_SITE_OTHER): Payer: Medicare Other | Admitting: Surgery

## 2012-03-28 ENCOUNTER — Encounter (INDEPENDENT_AMBULATORY_CARE_PROVIDER_SITE_OTHER): Payer: Self-pay | Admitting: Surgery

## 2012-03-28 VITALS — BP 162/80 | HR 72 | Temp 97.2°F | Resp 16 | Ht 60.0 in | Wt 172.8 lb

## 2012-03-28 DIAGNOSIS — C189 Malignant neoplasm of colon, unspecified: Secondary | ICD-10-CM

## 2012-03-28 NOTE — Progress Notes (Signed)
Vicki Woods 79 y.o.  Body mass index is 33.75 kg/(m^2).  Patient Active Problem List  Diagnosis  . HYPERTENSION  . MASS, LOCALIZED, SUPERFICIAL  . CARDIAC MURMUR  . CHEST PAIN  . HEARTBURN  . GI bleed  . Anemia  . Obesity  . Hypokalemia  . Cancer of colon    Allergies  Allergen Reactions  . Chocolate Shortness Of Breath and Swelling  . Honey Shortness Of Breath  . Penicillins Anaphylaxis  . Sulfonamide Derivatives Anaphylaxis  . Aspirin     Former doctor told patient to stop due to allergy shots she was receiving.  . Codeine Anxiety    Past Surgical History  Procedure Date  . Total abdominal hysterectomy w/ bilateral salpingoophorectomy   . Esophagogastroduodenoscopy 06/20/2011    Procedure: ESOPHAGOGASTRODUODENOSCOPY (EGD);  Surgeon: Malissa Hippo, MD;  [facility];  Service: Endoscopy;  Laterality: N/A;  . Colonoscopy 06/21/2011    Procedure: COLONOSCOPY;  Surgeon: Malissa Hippo, MD;  [facility];  Service: Endoscopy;  Laterality: N/A;  . Abdominal hysterectomy 1963  . Hemicolectomy 09/13/2011   Vicki Ribas, MD No diagnosis found.  Mrs. Vicki Woods saw Dr. Devra Dopp who did not feel that chemotherapy was indicated. She has had a urinary tract infection for which she has been referred to the urologist. She will see him later this month. She is having a little tenderness in the midportion of her incision but I do not feel a hernia. I told her that I would be glad to reevaluate her in 3 months. Impression doing well but with urinary tract infection chronic. Plan return in 3 months Matt B. Vicki Deutscher, MD, [facility] [phone] beeper [phone]  03/28/2012 11:03 AM

## 2012-04-19 ENCOUNTER — Ambulatory Visit (INDEPENDENT_AMBULATORY_CARE_PROVIDER_SITE_OTHER): Payer: Medicare Other | Admitting: Urology

## 2012-04-19 DIAGNOSIS — Z87440 Personal history of urinary (tract) infections: Secondary | ICD-10-CM

## 2012-04-19 DIAGNOSIS — R351 Nocturia: Secondary | ICD-10-CM

## 2012-05-10 ENCOUNTER — Ambulatory Visit (INDEPENDENT_AMBULATORY_CARE_PROVIDER_SITE_OTHER): Payer: Medicare Other | Admitting: Urology

## 2012-05-10 DIAGNOSIS — R351 Nocturia: Secondary | ICD-10-CM

## 2012-05-21 ENCOUNTER — Other Ambulatory Visit (HOSPITAL_COMMUNITY): Payer: Medicare Other

## 2012-05-28 ENCOUNTER — Encounter (HOSPITAL_COMMUNITY): Payer: Medicare Other | Attending: Oncology

## 2012-05-28 DIAGNOSIS — C183 Malignant neoplasm of hepatic flexure: Secondary | ICD-10-CM

## 2012-05-28 DIAGNOSIS — C189 Malignant neoplasm of colon, unspecified: Secondary | ICD-10-CM | POA: Insufficient documentation

## 2012-05-28 NOTE — Progress Notes (Signed)
Labs drawn today for cea 

## 2012-07-25 ENCOUNTER — Ambulatory Visit (INDEPENDENT_AMBULATORY_CARE_PROVIDER_SITE_OTHER): Payer: Medicare Other | Admitting: Surgery

## 2012-07-25 VITALS — BP 170/80 | HR 70 | Temp 97.0°F | Resp 20 | Ht 60.0 in | Wt 168.0 lb

## 2012-07-25 DIAGNOSIS — C189 Malignant neoplasm of colon, unspecified: Secondary | ICD-10-CM

## 2012-07-25 NOTE — Patient Instructions (Signed)
Followup with Dr Karilyn Cota for surveillance colonoscopy

## 2012-07-25 NOTE — Progress Notes (Signed)
Vicki Woods 79 y.o.  Body mass index is 32.81 kg/(m^2).  Patient Active Problem List   Diagnosis Date Noted  . Right hemicolectomy June 2013 for node negative adenocarcinoma 08/04/2011  . GI bleed 06/19/2011  . Anemia 06/19/2011  . Obesity 06/19/2011  . Hypokalemia 06/19/2011  . HYPERTENSION 12/22/2009  . MASS, LOCALIZED, SUPERFICIAL 12/22/2009  . CARDIAC MURMUR 12/22/2009  . CHEST PAIN 12/22/2009  . HEARTBURN 12/22/2009    Allergies  Allergen Reactions  . Chocolate Shortness Of Breath and Swelling  . Honey Shortness Of Breath  . Penicillins Anaphylaxis  . Sulfonamide Derivatives Anaphylaxis  . Aspirin     Former doctor told patient to stop due to allergy shots she was receiving.  . Codeine Anxiety    Past Surgical History  Procedure Laterality Date  . Total abdominal hysterectomy w/ bilateral salpingoophorectomy    . Esophagogastroduodenoscopy  06/20/2011    Procedure: ESOPHAGOGASTRODUODENOSCOPY (EGD);  Surgeon: Malissa Hippo, MD;  [facility];  Service: Endoscopy;  Laterality: N/A;  . Colonoscopy  06/21/2011    Procedure: COLONOSCOPY;  Surgeon: Malissa Hippo, MD;  [facility];  Service: Endoscopy;  Laterality: N/A;  . Abdominal hysterectomy  1963  . Hemicolectomy  09/13/2011   Colette Ribas, MD No diagnosis found.  Doing very well.  Will need followup colonoscopy with Dr. Karilyn Cota.  Followup with Dr. Mariel Sleet.   Incision is healed and no hernia felt.  Will see back in June 2015 Matt B. Daphine Deutscher, MD, [facility] [phone] beeper [phone]  07/25/2012 11:13 AM

## 2012-08-12 NOTE — Progress Notes (Signed)
Patient will need colonoscopy July or August 2014.

## 2012-08-13 ENCOUNTER — Encounter (HOSPITAL_COMMUNITY): Payer: Self-pay | Admitting: Pharmacy Technician

## 2012-08-13 NOTE — Progress Notes (Signed)
TCS noted for Aug 2014

## 2012-08-21 ENCOUNTER — Encounter (HOSPITAL_COMMUNITY): Payer: Medicare Other | Attending: Oncology

## 2012-08-21 DIAGNOSIS — C183 Malignant neoplasm of hepatic flexure: Secondary | ICD-10-CM

## 2012-08-21 DIAGNOSIS — C189 Malignant neoplasm of colon, unspecified: Secondary | ICD-10-CM | POA: Insufficient documentation

## 2012-08-21 NOTE — Progress Notes (Signed)
Labs drawn today for cea 

## 2012-08-22 ENCOUNTER — Encounter (HOSPITAL_COMMUNITY): Payer: Self-pay

## 2012-08-22 ENCOUNTER — Encounter (HOSPITAL_COMMUNITY)
Admission: RE | Admit: 2012-08-22 | Discharge: 2012-08-22 | Disposition: A | Discharge status: Home / Self Care | Payer: Medicare Other | Source: Ambulatory Visit | Attending: Ophthalmology | Admitting: Ophthalmology

## 2012-08-22 ENCOUNTER — Other Ambulatory Visit: Payer: Self-pay

## 2012-08-22 LAB — BASIC METABOLIC PANEL
BUN: 31 mg/dL — ABNORMAL HIGH (ref 6–23)
Creatinine, Ser: 1.06 mg/dL (ref 0.50–1.10)
GFR calc Af Amer: 56 mL/min — ABNORMAL LOW (ref >90)
GFR calc non Af Amer: 49 mL/min — ABNORMAL LOW (ref >90)

## 2012-08-22 NOTE — Patient Instructions (Addendum)
Your procedure is scheduled on: 08/27/2012  Report to [facility] at  1030   AM.  Call this number if you have problems the morning of surgery: [phone]   Do not eat food or drink liquids :After Midnight.      Take these medicines the morning of surgery with A SIP OF WATER: norvasc,capoten, hyzaar   Do not wear jewelry, make-up or nail polish.  Do not wear lotions, powders, or perfumes.   Do not shave 48 hours prior to surgery.  Do not bring valuables to the hospital.  Contacts, dentures or bridgework may not be worn into surgery.  Leave suitcase in the car. After surgery it may be brought to your room.  For patients admitted to the hospital, checkout time is 11:00 AM the day of discharge.   Patients discharged the day of surgery will not be allowed to drive home.  :     Please read over the following fact sheets that you were given: Coughing and Deep Breathing, Surgical Site Infection Prevention, Anesthesia Post-op Instructions and Care and Recovery After Surgery    Cataract A cataract is a clouding of the lens of the eye. When a lens becomes cloudy, vision is reduced based on the degree and nature of the clouding. Many cataracts reduce vision to some degree. Some cataracts make people more near-sighted as they develop. Other cataracts increase glare. Cataracts that are ignored and become worse can sometimes look white. The white color can be seen through the pupil. CAUSES   Aging. However, cataracts may occur at any age, even in newborns.   Certain drugs.   Trauma to the eye.   Certain diseases such as diabetes.   Specific eye diseases such as chronic inflammation inside the eye or a sudden attack of a rare form of glaucoma.   Inherited or acquired medical problems.  SYMPTOMS   Gradual, progressive drop in vision in the affected eye.   Severe, rapid visual loss. This most often happens when trauma is the cause.  DIAGNOSIS  To detect a cataract, an eye doctor examines the  lens. Cataracts are best diagnosed with an exam of the eyes with the pupils enlarged (dilated) by drops.  TREATMENT  For an early cataract, vision may improve by using different eyeglasses or stronger lighting. If that does not help your vision, surgery is the only effective treatment. A cataract needs to be surgically removed when vision loss interferes with your everyday activities, such as driving, reading, or watching TV. A cataract may also have to be removed if it prevents examination or treatment of another eye problem. Surgery removes the cloudy lens and usually replaces it with a substitute lens (intraocular lens, IOL).  At a time when both you and your doctor agree, the cataract will be surgically removed. If you have cataracts in both eyes, only one is usually removed at a time. This allows the operated eye to heal and be out of danger from any possible problems after surgery (such as infection or poor wound healing). In rare cases, a cataract may be doing damage to your eye. In these cases, your caregiver may advise surgical removal right away. The vast majority of people who have cataract surgery have better vision afterward. HOME CARE INSTRUCTIONS  If you are not planning surgery, you may be asked to do the following:  Use different eyeglasses.   Use stronger or brighter lighting.   Ask your eye doctor about reducing your medicine dose or changing  medicines if it is thought that a medicine caused your cataract. Changing medicines does not make the cataract go away on its own.   Become familiar with your surroundings. Poor vision can lead to injury. Avoid bumping into things on the affected side. You are at a higher risk for tripping or falling.   Exercise extreme care when driving or operating machinery.   Wear sunglasses if you are sensitive to bright light or experiencing problems with glare.  SEEK IMMEDIATE MEDICAL CARE IF:   You have a worsening or sudden vision loss.   You  notice redness, swelling, or increasing pain in the eye.   You have a fever.  Document Released: 03/06/2005 Document Revised: 02/23/2011 Document Reviewed: 10/28/2010 [facility] Patient Information 2012 [place].PATIENT INSTRUCTIONS POST-ANESTHESIA  IMMEDIATELY FOLLOWING SURGERY:  Do not drive or operate machinery for the first twenty four hours after surgery.  Do not make any important decisions for twenty four hours after surgery or while taking narcotic pain medications or sedatives.  If you develop intractable nausea and vomiting or a severe headache please notify your doctor immediately.  FOLLOW-UP:  Please make an appointment with your surgeon as instructed. You do not need to follow up with anesthesia unless specifically instructed to do so.  WOUND CARE INSTRUCTIONS (if applicable):  Keep a dry clean dressing on the anesthesia/puncture wound site if there is drainage.  Once the wound has quit draining you may leave it open to air.  Generally you should leave the bandage intact for twenty four hours unless there is drainage.  If the epidural site drains for more than 36-48 hours please call the anesthesia department.  QUESTIONS?:  Please feel free to call your physician or the hospital operator if you have any questions, and they will be happy to assist you.

## 2012-08-26 MED ORDER — CYCLOPENTOLATE-PHENYLEPHRINE 0.2-1 % OP SOLN
OPHTHALMIC | Status: AC
  Filled 2012-08-26: qty 2

## 2012-08-26 MED ORDER — TETRACAINE HCL 0.5 % OP SOLN
OPHTHALMIC | Status: AC
  Filled 2012-08-26: qty 2

## 2012-08-26 MED ORDER — FLURBIPROFEN SODIUM 0.03 % OP SOLN
OPHTHALMIC | Status: AC
  Filled 2012-08-26: qty 2.5

## 2012-08-27 ENCOUNTER — Encounter (HOSPITAL_COMMUNITY)
Admission: RE | Disposition: A | Discharge status: Home / Self Care | Payer: Self-pay | Source: Ambulatory Visit | Attending: Ophthalmology

## 2012-08-27 ENCOUNTER — Encounter (HOSPITAL_COMMUNITY): Payer: Self-pay | Admitting: Anesthesiology

## 2012-08-27 ENCOUNTER — Ambulatory Visit (HOSPITAL_COMMUNITY)
Admission: RE | Admit: 2012-08-27 | Discharge: 2012-08-27 | Disposition: A | Discharge status: Home / Self Care | Payer: Medicare Other | Source: Ambulatory Visit | Attending: Ophthalmology | Admitting: Ophthalmology

## 2012-08-27 ENCOUNTER — Ambulatory Visit (HOSPITAL_COMMUNITY): Payer: Medicare Other | Admitting: Anesthesiology

## 2012-08-27 DIAGNOSIS — Z01810 Encounter for preprocedural cardiovascular examination: Secondary | ICD-10-CM | POA: Insufficient documentation

## 2012-08-27 DIAGNOSIS — H2510 Age-related nuclear cataract, unspecified eye: Secondary | ICD-10-CM | POA: Insufficient documentation

## 2012-08-27 DIAGNOSIS — Z01812 Encounter for preprocedural laboratory examination: Secondary | ICD-10-CM | POA: Insufficient documentation

## 2012-08-27 DIAGNOSIS — Z79899 Other long term (current) drug therapy: Secondary | ICD-10-CM | POA: Insufficient documentation

## 2012-08-27 DIAGNOSIS — I10 Essential (primary) hypertension: Secondary | ICD-10-CM | POA: Insufficient documentation

## 2012-08-27 HISTORY — PX: CATARACT EXTRACTION W/PHACO: SHX586

## 2012-08-27 SURGERY — PHACOEMULSIFICATION, CATARACT, WITH IOL INSERTION
Anesthesia: Monitor Anesthesia Care | Site: Eye | Laterality: Right | Wound class: Clean

## 2012-08-27 MED ORDER — PHENYLEPHRINE HCL 2.5 % OP SOLN
1.0000 [drp] | OPHTHALMIC | Status: AC
  Administered 2012-08-27 (×3): 1 [drp] via OPHTHALMIC

## 2012-08-27 MED ORDER — KETOROLAC TROMETHAMINE 0.5 % OP SOLN
1.0000 [drp] | OPHTHALMIC | Status: AC
  Administered 2012-08-27 (×3): 1 [drp] via OPHTHALMIC

## 2012-08-27 MED ORDER — EPINEPHRINE HCL 1 MG/ML IJ SOLN
INTRAMUSCULAR | Status: AC
  Filled 2012-08-27: qty 1

## 2012-08-27 MED ORDER — MIDAZOLAM HCL 2 MG/2ML IJ SOLN
INTRAMUSCULAR | Status: AC
  Filled 2012-08-27: qty 2

## 2012-08-27 MED ORDER — TETRACAINE 0.5 % OP SOLN OPTIME - NO CHARGE
OPHTHALMIC | Status: DC | PRN
  Administered 2012-08-27: 1 [drp] via OPHTHALMIC

## 2012-08-27 MED ORDER — BSS IO SOLN
INTRAOCULAR | Status: DC | PRN
  Administered 2012-08-27: 15 mL via INTRAOCULAR

## 2012-08-27 MED ORDER — PHENYLEPHRINE HCL 2.5 % OP SOLN
OPHTHALMIC | Status: AC
  Filled 2012-08-27: qty 2

## 2012-08-27 MED ORDER — LACTATED RINGERS IV SOLN
INTRAVENOUS | Status: DC
  Administered 2012-08-27: 11:00:00 via INTRAVENOUS

## 2012-08-27 MED ORDER — PROVISC 10 MG/ML IO SOLN
INTRAOCULAR | Status: DC | PRN
  Administered 2012-08-27: 8.5 mg via INTRAOCULAR

## 2012-08-27 MED ORDER — MIDAZOLAM HCL 2 MG/2ML IJ SOLN
1.0000 mg | INTRAMUSCULAR | Status: DC | PRN
  Administered 2012-08-27: 2 mg via INTRAVENOUS

## 2012-08-27 MED ORDER — EPINEPHRINE HCL 1 MG/ML IJ SOLN
INTRAOCULAR | Status: DC | PRN
  Administered 2012-08-27: 12:00:00

## 2012-08-27 MED ORDER — CYCLOPENTOLATE-PHENYLEPHRINE 0.2-1 % OP SOLN
1.0000 [drp] | OPHTHALMIC | Status: AC
  Administered 2012-08-27 (×3): 1 [drp] via OPHTHALMIC

## 2012-08-27 MED ORDER — TETRACAINE HCL 0.5 % OP SOLN
1.0000 [drp] | OPHTHALMIC | Status: AC
  Administered 2012-08-27 (×3): 1 [drp] via OPHTHALMIC

## 2012-08-27 SURGICAL SUPPLY — 23 items
CAPSULAR TENSION RING-AMO (OPHTHALMIC RELATED) IMPLANT
CLOTH BEACON ORANGE TIMEOUT ST (SAFETY) ×1 IMPLANT
EYE SHIELD UNIVERSAL CLEAR (GAUZE/BANDAGES/DRESSINGS) ×1 IMPLANT
GLOVE BIO SURGEON STRL SZ 6.5 (GLOVE) ×1 IMPLANT
GLOVE ECLIPSE 6.5 STRL STRAW (GLOVE) IMPLANT
GLOVE ECLIPSE 7.0 STRL STRAW (GLOVE) IMPLANT
GLOVE EXAM NITRILE LRG STRL (GLOVE) IMPLANT
GLOVE EXAM NITRILE MD LF STRL (GLOVE) ×1 IMPLANT
GLOVE SKINSENSE NS SZ6.5 (GLOVE)
GLOVE SKINSENSE STRL SZ6.5 (GLOVE) IMPLANT
HEALON 5 0.6 ML (INTRAOCULAR LENS) IMPLANT
KIT VITRECTOMY (OPHTHALMIC RELATED) IMPLANT
PAD ARMBOARD 7.5X6 YLW CONV (MISCELLANEOUS) ×1 IMPLANT
PROC W NO LENS (INTRAOCULAR LENS)
PROC W SPEC LENS (INTRAOCULAR LENS)
PROCESS W NO LENS (INTRAOCULAR LENS) IMPLANT
PROCESS W SPEC LENS (INTRAOCULAR LENS) IMPLANT
RING MALYGIN (MISCELLANEOUS) ×1 IMPLANT
SIGHTPATH CAT PROC W REG LENS (Ophthalmic Related) ×2 IMPLANT
TAPE SURG TRANSPORE 1 IN (GAUZE/BANDAGES/DRESSINGS) IMPLANT
TAPE SURGICAL TRANSPORE 1 IN (GAUZE/BANDAGES/DRESSINGS) ×1
VISCOELASTIC ADDITIONAL (OPHTHALMIC RELATED) IMPLANT
WATER STERILE IRR 250ML POUR (IV SOLUTION) ×1 IMPLANT

## 2012-08-27 NOTE — Op Note (Addendum)
Patient brought to the operating room and prepped and draped in the usual manner.  Lid speculum inserted in right eye.  Stab incision made at the twelve o'clock position.  Provisc instilled in the anterior chamber.   A 2.4 mm. Stab incision was made temporally. Due to a small pupil, a Malugyn Ring was inserted.   An anterior capsulotomy was done with a bent 25 gauge needle.  The nucleus was hydrodissected.  The Phaco tip was inserted in the anterior chamber and the nucleus was emulsified.  CDE was 9.80.  The cortical material was then removed with the I and A tip.  Posterior capsule was the polished.  The anterior chamber was deepened with Provisc.  A 21.0 Diopter Rayner 570C IOL was then inserted in the capsular bag. The Malugyn Ring was removed.   Provisc was then removed with the I and A tip.  The wound was then hydrated.  Patient sent to the Recovery Room in good condition with follow up in my office. Preoperative Diagnosis: Nuclear Cataract OD  Postoperative Diagnosis: Same  Cataract Extraction OD

## 2012-08-27 NOTE — H&P (Signed)
The patient was re examined and there is no change in the patients condition since the original H and P. 

## 2012-08-27 NOTE — Transfer of Care (Signed)
Immediate Anesthesia Transfer of Care Note  Patient: Vicki Woods  Procedure(s) Performed: Procedure(s) with comments: CATARACT EXTRACTION PHACO AND INTRAOCULAR LENS PLACEMENT (IOC) (Right) - CDE:9.80  Patient Location: PACU and Short Stay  Anesthesia Type:MAC  Level of Consciousness: awake  Airway & Oxygen Therapy: Patient Spontanous Breathing  Post-op Assessment: Report given to PACU RN  Post vital signs: Reviewed  Complications: No apparent anesthesia complications

## 2012-08-27 NOTE — Anesthesia Preprocedure Evaluation (Addendum)
Anesthesia Evaluation  Patient identified by MRN, date of birth, ID band Patient awake    Reviewed: Allergy & Precautions, H&P , NPO status , Patient's Chart, lab work & pertinent test results, reviewed documented beta blocker date and time   Airway Mallampati: II TM Distance: >3 FB Neck ROM: Full    Dental  (+) Teeth Intact, Poor Dentition and Dental Advisory Given   Pulmonary neg pulmonary ROS,  breath sounds clear to auscultation- rhonchi        Cardiovascular hypertension, Pt. on medications Rhythm:Regular Rate:Normal  Denies cardiac symptoms   Neuro/Psych  Headaches, negative neurological ROS  negative psych ROS   GI/Hepatic Neg liver ROS, GERD-  ,Colon cancer   Endo/Other  negative endocrine ROS  Renal/GU Renal diseasenegative Renal ROS  negative genitourinary   Musculoskeletal negative musculoskeletal ROS (+)   Abdominal   Peds negative pediatric ROS (+)  Hematology negative hematology ROS (+) Anemia, Hgb 10.5   Anesthesia Other Findings   Reproductive/Obstetrics negative OB ROS                           Anesthesia Physical Anesthesia Plan  ASA: III  Anesthesia Plan: MAC   Post-op Pain Management:    Induction:   Airway Management Planned: Nasal Cannula  Additional Equipment:   Intra-op Plan:   Post-operative Plan:   Informed Consent: I have reviewed the patients History and Physical, chart, labs and discussed the procedure including the risks, benefits and alternatives for the proposed anesthesia with the patient or authorized representative who has indicated his/her understanding and acceptance.     Plan Discussed with:   Anesthesia Plan Comments:         Anesthesia Quick Evaluation

## 2012-08-27 NOTE — Anesthesia Postprocedure Evaluation (Signed)
  Anesthesia Post-op Note  Patient: Vicki Woods  Procedure(s) Performed: Procedure(s) with comments: CATARACT EXTRACTION PHACO AND INTRAOCULAR LENS PLACEMENT (IOC) (Right) - CDE:9.80  Patient Location: Short Stay  Anesthesia Type:MAC  Level of Consciousness: awake, alert  and oriented  Airway and Oxygen Therapy: Patient Spontanous Breathing  Post-op Pain: none  Post-op Assessment: Post-op Vital signs reviewed, Patient's Cardiovascular Status Stable, Respiratory Function Stable, Patent Airway and No signs of Nausea or vomiting  Post-op Vital Signs: Reviewed and stable  Complications: No apparent anesthesia complications

## 2012-08-29 ENCOUNTER — Encounter (HOSPITAL_COMMUNITY): Payer: Self-pay | Admitting: Ophthalmology

## 2012-10-23 ENCOUNTER — Telehealth (INDEPENDENT_AMBULATORY_CARE_PROVIDER_SITE_OTHER): Payer: Self-pay | Admitting: *Deleted

## 2012-10-23 NOTE — Telephone Encounter (Signed)
Last TCS was 06/2011, had polyps (adenocarcinoma) -- when is patient due again

## 2012-10-24 ENCOUNTER — Encounter (INDEPENDENT_AMBULATORY_CARE_PROVIDER_SITE_OTHER): Payer: Self-pay | Admitting: *Deleted

## 2012-10-24 NOTE — Telephone Encounter (Signed)
She needs TCS  In near future.

## 2012-10-24 NOTE — Telephone Encounter (Signed)
Letter mailed to patient asking him/her to call me so I can schedule colonoscopy

## 2012-11-19 ENCOUNTER — Encounter (HOSPITAL_COMMUNITY): Payer: Medicare Other | Attending: Oncology

## 2012-11-19 DIAGNOSIS — C183 Malignant neoplasm of hepatic flexure: Secondary | ICD-10-CM

## 2012-11-19 DIAGNOSIS — C189 Malignant neoplasm of colon, unspecified: Secondary | ICD-10-CM | POA: Insufficient documentation

## 2012-11-19 NOTE — Progress Notes (Unsigned)
Vicki Woods's reason for visit today are for labs as scheduled per MD orders.  Venipuncture performed with a 23 gauge butterfly needle to L Antecubital.  Volanda Napoleon tolerated venipuncture well and without incident; questions were answered and patient was discharged.

## 2012-11-21 ENCOUNTER — Encounter (HOSPITAL_BASED_OUTPATIENT_CLINIC_OR_DEPARTMENT_OTHER): Payer: Medicare Other | Admitting: Oncology

## 2012-11-21 ENCOUNTER — Encounter (HOSPITAL_COMMUNITY): Payer: Self-pay | Admitting: Oncology

## 2012-11-21 VITALS — BP 169/78 | HR 75 | Temp 97.8°F | Resp 18 | Wt 159.7 lb

## 2012-11-21 DIAGNOSIS — C189 Malignant neoplasm of colon, unspecified: Secondary | ICD-10-CM

## 2012-11-21 NOTE — Patient Instructions (Addendum)
[facility] Discharge Instructions  RECOMMENDATIONS MADE BY THE CONSULTANT AND ANY TEST RESULTS WILL BE SENT TO YOUR REFERRING PHYSICIAN.  EXAM FINDINGS BY THE PHYSICIAN TODAY AND SIGNS OR SYMPTOMS TO REPORT TO CLINIC OR PRIMARY PHYSICIAN: Exam and discussion by Dellis Anes, PA-C.  National Comprehensive Cancer Network guidelines discussed and copy given.  Once you have reviewed the information call us and let us know what you want to do.  MEDICATIONS PRESCRIBED:  none  INSTRUCTIONS GIVEN AND DISCUSSED: Report changes in bowel habits, blood in your bowel movement or other problems.  SPECIAL INSTRUCTIONS/FOLLOW-UP: To be arranged - call us and let us know what you want to do.  Thank you for choosing [facility] to provide your oncology and hematology care.  To afford each patient quality time with our providers, please arrive at least 15 minutes before your scheduled appointment time.  With your help, our goal is to use those 15 minutes to complete the necessary work-up to ensure our physicians have the information they need to help with your evaluation and healthcare recommendations.    Effective January 1st, 2014, we ask that you re-schedule your appointment with our physicians should you arrive 10 or more minutes late for your appointment.  We strive to give you quality time with our providers, and arriving late affects you and other patients whose appointments are after yours.    Again, thank you for choosing [facility]  Our hope is that these requests will decrease the amount of time that you wait before being seen by our physicians.       _____________________________________________________________  Should you have questions after your visit to [facility], please contact our office at [phone] between the hours of 8:30 a.m. and 5:00 p.m.  Voicemails left after 4:30 p.m. will not be returned until the following  business day.  For prescription refill requests, have your pharmacy contact our office with your prescription refill request.

## 2012-11-21 NOTE — Progress Notes (Signed)
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  INTERVAL HISTORY: Vicki Woods 79 y.o. female returns for  regular  visit for followup of Stage II A (T3N0M0) colon cancer, S/P right hemicolectomy 09/13/2011 by Dr. Daphine Deutscher. 16 nodes were negative no LV I was seen but she did have perineural space invasion found and a 4.5 cm tumor. This was a grade 1 tumor, but ulcerated. 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 There is no family history of colon cancer that she is aware of. She has no children.   [name] is doing very well from an oncology standpoint.  She denies any changes in her bowel habits including color, caliber, and frequency.  She denies any blood in her stool and black tarry stool.    She reports that she received a letter from Dr. Patty Sermons office to establish a date for a follow-up colonoscopy and she reports that she will respond to this letter, but wanted to be seen by [place] first to make sure the colonoscopy was appropriate.    I discussed the NCCN guidelines regarding surveillance for a T3 lesion of colon (Stage II) which includes CEA every 3 months x 2 years followed by every 6 months x 3 years, annual CT abd/pelvis (which she is overdue for), follow-up visits every 6 months x 2 years, and colonoscopy 1 year out from surgery.  I have reviewed Dr. Thornton Papas previous note and he reports CEA every 3 months with annual visits, but I educated the patient regarding the guidelines.   After a long discussion, the patient has decided to follow-up with Dr. Karilyn Cota for a colonoscopy.  She wishes to call the clinic for further visits as she needs "time to comprehend" the information and "pray to God."     I explained to the patient that these are guidelines and it does not mean that we need to follow them absolutely if she does not want to.  She understands this, but remains with her decision to contact us regarding her desires in the future.    I recommended making a 6 month follow-up visit and CEA every 3 months and then re-convene regarding her options and her wishes regarding surveillance.  She declines and reports that she will call us back.  Oncologically, she denies any complaints and ROS questioning is negative.    Past Medical History  Diagnosis Date  . Hypertension   . GERD (gastroesophageal reflux disease)   . Proteinuria   . Headache(784.0)   . Arthritis     knees,  shoulders  . Leg swelling   . Rectal bleeding   . Weakness   . Blood in stool   . Swelling     in legs   . Chronic kidney disease     chronic kidney disease   . Adenocarcinoma 09/13/2011    At the hepatic flexure    has HYPERTENSION; MASS, LOCALIZED, SUPERFICIAL; CARDIAC MURMUR; CHEST PAIN; HEARTBURN; GI bleed; Anemia; Obesity; Hypokalemia; and Right hemicolectomy June 2013 for node negative adenocarcinoma on her problem list.     is allergic to chocolate; honey; penicillins; sulfonamide derivatives; aspirin; and codeine.  Ms. Beed had no medications administered during this visit.  Past Surgical History  Procedure Laterality  Date  . Total abdominal hysterectomy w/ bilateral salpingoophorectomy    . Esophagogastroduodenoscopy  06/20/2011    Procedure: ESOPHAGOGASTRODUODENOSCOPY (EGD);  Surgeon: Malissa Hippo, MD;  Location: [facility];  Service: Endoscopy;  Laterality: N/A;  . Colonoscopy  06/21/2011    Procedure: COLONOSCOPY;  Surgeon: Malissa Hippo, MD;  Location: [facility];  Service: Endoscopy;  Laterality: N/A;  . Abdominal hysterectomy  1963  . Hemicolectomy  09/13/2011  . Cataract extraction w/phaco Right 08/27/2012    Procedure: CATARACT EXTRACTION PHACO AND INTRAOCULAR LENS  PLACEMENT (IOC);  Surgeon: Loraine Leriche T. Nile Riggs, MD;  Location: [facility] ORS;  Service: Ophthalmology;  Laterality: Right;  CDE:9.80    Denies any headaches, dizziness, double vision, fevers, chills, night sweats, nausea, vomiting, diarrhea, constipation, chest pain, heart palpitations, shortness of breath, blood in stool, black tarry stool, urinary pain, urinary burning, urinary frequency, hematuria.   PHYSICAL EXAMINATION  ECOG PERFORMANCE STATUS: 1 - Symptomatic but completely ambulatory  Filed Vitals:   11/21/12 1134  BP: 169/78  Pulse: 75  Temp: 97.8 F (36.6 C)  Resp: 18    GENERAL:alert, no distress, well nourished, well developed, comfortable, cooperative and smiling SKIN: skin color, texture, turgor are normal, no rashes or significant lesions HEAD: Normocephalic, No masses, lesions, tenderness or abnormalities EYES: normal, PERRLA, EOMI, Conjunctiva are pink and non-injected EARS: External ears normal OROPHARYNX:mucous membranes are moist  NECK: supple, no adenopathy, thyroid normal size, non-tender, without nodularity, no stridor, non-tender, trachea midline LYMPH:  no palpable lymphadenopathy, no hepatosplenomegaly BREAST:not examined LUNGS: clear to auscultation and percussion HEART: regular rate & rhythm, no murmurs, no gallops, S1 normal and S2 normal ABDOMEN:abdomen soft, non-tender, obese, normal bowel sounds, no masses or organomegaly and no hepatosplenomegaly, midline surgical site is well healed without any obvious abnormalities. BACK: Back symmetric, no curvature., No CVA tenderness EXTREMITIES:less then 2 second capillary refill, no joint deformities, effusion, or inflammation, no skin discoloration, no clubbing, no cyanosis, positive findings:  edema B/L 3+ B/L LE edema  NEURO: alert & oriented x 3 with fluent speech, no focal motor/sensory deficits, gait normal    LABORATORY DATA: CBC    Component Value Date/Time   WBC 8.3 11/22/2011 1644   RBC 3.73* 11/22/2011 1644    HGB 11.3* 08/22/2012 1415   HCT 33.5* 08/22/2012 1415   PLT 263 11/22/2011 1644   MCV 86.9 11/22/2011 1644   MCH 28.7 11/22/2011 1644   MCHC 33.0 11/22/2011 1644   RDW 16.4* 11/22/2011 1644   LYMPHSABS 1.0 11/22/2011 1644   MONOABS 0.2 11/22/2011 1644   EOSABS 0.0 11/22/2011 1644   BASOSABS 0.0 11/22/2011 1644      Chemistry      Component Value Date/Time   NA 145 08/22/2012 1415   K 3.8 08/22/2012 1415   CL 108 08/22/2012 1415   CO2 28 08/22/2012 1415   BUN 31* 08/22/2012 1415   CREATININE 1.06 08/22/2012 1415      Component Value Date/Time   CALCIUM 9.9 08/22/2012 1415   ALKPHOS 81 11/22/2011 1644   AST 15 11/22/2011 1644   ALT 8 11/22/2011 1644   BILITOT 0.4 11/22/2011 1644     Lab Results  Component Value Date   CEA 1.7 11/19/2012      ASSESSMENT:  1. Stage II A (T3N0M0) colon cancer, S/P right hemicolectomy 09/13/2011 by Dr. Daphine Deutscher. 16 nodes were negative no LV I was seen but she did have perineural space invasion found and a 4.5 cm tumor. This was a  grade 1 tumor, but ulcerated. 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 There is no family history of colon cancer that she is aware of. She has no children.  2. HTN 3. Chronic LE edema  Patient Active Problem List   Diagnosis Date Noted  . Right hemicolectomy June 2013 for node negative adenocarcinoma 08/04/2011  . GI bleed 06/19/2011  . Anemia 06/19/2011  . Obesity 06/19/2011  . Hypokalemia 06/19/2011  . HYPERTENSION 12/22/2009  . MASS, LOCALIZED, SUPERFICIAL 12/22/2009  . CARDIAC MURMUR 12/22/2009  . CHEST PAIN 12/22/2009  . HEARTBURN 12/22/2009     PLAN:  1. I personally reviewed and went over laboratory results with the patient. 2. Follow-up with Dr. Karilyn Cota as directed for repeat colonoscopy. 3. Discussed the NCCN guidelines for T3 N0 Colon Cancer (Stage II)  A. Colonoscopy 1 year from surgical resention  B. CEA every 3 months x 2 years, then CEA every 6 months x 3 years (total of 5 years of CEA  surveillance)  C. CT abd/pelvis annually x 5 years  D. Follow-up visits every 6 months x 2-3 years 4. Patient declines having any future visits and appointments made for her as she wants time to comprehend the above mentioned information.  She declines lab appointments and 6 month office visit.  5. Recommend surveillance per NCCN guidelines or some sort of hybrid of these recommendations per patient's wishes. 6. NCCN guidelines printed for the patient to review at home.  7. Will await a return telephone call from the patient to develop this plan.   THERAPY PLAN:  The patient has refused the development of future appointments at this time.  I recommended following NCCN guidelines for surveillance of her H/O Stage II Colon Cancer.  After discussion with the patient, it was clear that I may need to develop a hybrid of NCCN guidelines + the patient's wishes for surveillance (such as CEA every 3 months and annual visit).  She has concluded to consider he options and contact the clinic at a later date for appointment development.  She anticipates setting-up an appointment with Dr. Karilyn Cota for colonoscopy.  All questions were answered. The patient knows to call the clinic with any problems, questions or concerns. We can certainly see the patient much sooner if necessary.  Patient and plan discussed with Dr. Erline Hau and he is in agreement with the aforementioned.   KEFALAS,THOMAS

## 2012-11-22 ENCOUNTER — Telehealth (INDEPENDENT_AMBULATORY_CARE_PROVIDER_SITE_OTHER): Payer: Self-pay | Admitting: General Surgery

## 2012-11-22 NOTE — Telephone Encounter (Signed)
Pt called for Dr. Ermalene Searing opinion.  Her new oncologist has recommended Q51mon follow-ups, whereas Dr. Mariel Sleet only wanted to see her Q72mon.  She follows up with Dr. Karilyn Cota regularly as well.  She is not convinced she needs to be seen and worked up with labs, etc., so often by the new oncologist and wants Dr. Ermalene Searing input.  Please call her.

## 2012-11-25 NOTE — Progress Notes (Signed)
Patient has been sent letter to call us to schedule colonoscopy

## 2012-11-29 ENCOUNTER — Telehealth (INDEPENDENT_AMBULATORY_CARE_PROVIDER_SITE_OTHER): Payer: Self-pay

## 2012-11-29 NOTE — Telephone Encounter (Signed)
LMOM asking pt to call office so that we may answer her questions - NO she does NOT need to be seen once a month.  However Dr. Daphine Deutscher would like to follow her on a yearly basis for 5 years.

## 2012-11-29 NOTE — Telephone Encounter (Signed)
Message copied by Horatio Pel on Fri Nov 29, 2012  9:34 AM ------      Message from: Grace Isaac      Created: Mon Nov 25, 2012  4:05 PM        This is the patient........        Pt saw PA-C last week. She wants to know if it is necessary to see Dr. Daphine Deutscher once a month for 5 yrs for colon reck's like the the PA-C said. She said all tests came back normal and thinks this is excessive. Please call her back and let her know!         Vicki Woods                                                  ------

## 2012-11-29 NOTE — Progress Notes (Signed)
Colonoscopy will be scheduled when patient gives [place] a call

## 2012-12-12 ENCOUNTER — Other Ambulatory Visit (INDEPENDENT_AMBULATORY_CARE_PROVIDER_SITE_OTHER): Payer: Self-pay | Admitting: *Deleted

## 2012-12-12 DIAGNOSIS — Z86010 Personal history of colonic polyps: Secondary | ICD-10-CM

## 2012-12-18 ENCOUNTER — Telehealth (HOSPITAL_COMMUNITY): Payer: Self-pay

## 2013-01-07 ENCOUNTER — Encounter (INDEPENDENT_AMBULATORY_CARE_PROVIDER_SITE_OTHER): Payer: Self-pay | Admitting: *Deleted

## 2013-01-09 ENCOUNTER — Other Ambulatory Visit (HOSPITAL_COMMUNITY): Payer: Self-pay | Admitting: Family Medicine

## 2013-01-09 DIAGNOSIS — Z139 Encounter for screening, unspecified: Secondary | ICD-10-CM

## 2013-01-20 ENCOUNTER — Telehealth (INDEPENDENT_AMBULATORY_CARE_PROVIDER_SITE_OTHER): Payer: Self-pay | Admitting: *Deleted

## 2013-01-20 DIAGNOSIS — Z1211 Encounter for screening for malignant neoplasm of colon: Secondary | ICD-10-CM

## 2013-01-20 NOTE — Telephone Encounter (Signed)
  Procedure: tcs  Reason/Indication:  Hx polyps  Has patient had this procedure before?  Yes, 2013 (EPIC)  If so, when, by whom and where?    Is there a family history of colon cancer?  no  Who?  What age when diagnosed?    Is patient diabetic?   no      Does patient have prosthetic heart valve?  no  Do you have a pacemaker?  no  Has patient ever had endocarditis? no  Has patient had joint replacement within last 12 months?  no  Does patient tend to be constipated or take laxatives? no  Is patient on Coumadin, Plavix and/or Aspirin? no  Medications: tylenol prn, amlodipine 50 mg daily, losartan 100/25 mg daily, vit d 1000 mg daily  Allergies: codeine, pcn, asa  Medication Adjustment:   Procedure date & time: 02/12/13 at 930

## 2013-01-20 NOTE — Telephone Encounter (Signed)
Patient needs trilyte 

## 2013-01-22 MED ORDER — PEG 3350-KCL-NA BICARB-NACL 420 G PO SOLR: 4000.0000 mL | Freq: Once | ORAL | Status: DC

## 2013-01-22 NOTE — Telephone Encounter (Signed)
agree

## 2013-01-23 ENCOUNTER — Ambulatory Visit (HOSPITAL_COMMUNITY)
Admission: RE | Admit: 2013-01-23 | Discharge: 2013-01-23 | Disposition: A | Discharge status: Home / Self Care | Payer: Medicare Other | Source: Ambulatory Visit | Attending: Family Medicine | Admitting: Family Medicine

## 2013-01-23 DIAGNOSIS — Z1231 Encounter for screening mammogram for malignant neoplasm of breast: Secondary | ICD-10-CM | POA: Insufficient documentation

## 2013-01-23 DIAGNOSIS — Z139 Encounter for screening, unspecified: Secondary | ICD-10-CM

## 2013-02-12 ENCOUNTER — Ambulatory Visit: Admit: 2013-02-12 | Payer: Medicare Other | Admitting: Internal Medicine

## 2013-02-12 SURGERY — COLONOSCOPY
Anesthesia: Moderate Sedation

## 2013-03-21 ENCOUNTER — Other Ambulatory Visit (HOSPITAL_COMMUNITY): Payer: Self-pay

## 2013-03-21 DIAGNOSIS — C189 Malignant neoplasm of colon, unspecified: Secondary | ICD-10-CM

## 2013-03-23 NOTE — Progress Notes (Signed)
Vicki Kilts, MD [address] [place] [address]  Right hemicolectomy June 2013 for node negative adenocarcinoma - Plan: CBC with Differential, Comprehensive metabolic panel, CBC with Differential, Comprehensive metabolic panel, CEA, CANCELED: CT Abdomen Pelvis W Contrast  Anemia - Plan: CBC with Differential  CURRENT THERAPY: Surveillance per NCCN guidelines  INTERVAL HISTORY: Vicki Woods 80 y.o. female returns for  regular  visit for followup of Stage II A (T3N0M0) colon cancer, S/P right hemicolectomy 09/13/2011 by Dr. Hassell Done. 16 nodes were negative no LV I was seen but she did have perineural space invasion found and a 4.5 cm tumor. This was a grade 1 tumor, but ulcerated. 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 There is no family history of colon cancer that she is aware of. She has no children.    On our last encounter, Vicki Woods was very resistent to follow surveillance recommendations per NCCN guidelines as she "needed time to think about" the recommendations.  I believe it was too much information at one for her.  Therefore, we will adapt the NCCN guidelines for her to hopefully decrease her confusion and hopefully improve compliance and build trust between [place].   Surveillance recommendations per NCCN guidelines for a T3 lesion of colon (Stage II) are as follows:  NCCN guidelines for surveillance for T3/T4, N0-2, M0 colon cancer are:   A. H+P every 3-6 months x 2 years and then every 6 months for a total of 5 years   B. CEA every 3 months x 2 years and then every 6 months for a total of 5 years   C. CT abd/pelvis annually for up to 5 years   D. Colonoscopy in 1 year except if no preoperative colonoscopy due to obstructing lesion, colonoscopy in 3-6 months.    1. If advanced adenoma, repeat in 1 year   2. If no advanced adenoma, repeat in 3 years, then every 5 years  E. PET scan not routinely  recommended.  Labs are pending from today.  She shows me a right upper arm area that has a large hardened black lesion that has broken through the skin.  I suspect it is a sebaceous cyst that is coming to a head.  She denies any pain or irritation.  I recommended warm compressed and follow-up with PCP with any issues.   Oncologically, she denies any complaints and ROS questioning is negative.   Past Medical History  Diagnosis Date  . Hypertension   . GERD (gastroesophageal reflux disease)   . Proteinuria   . Headache(784.0)   . Arthritis     knees,  shoulders  . Leg swelling   . Rectal bleeding   . Weakness   . Blood in stool   . Swelling     in legs   . Chronic kidney disease     chronic kidney disease   . Adenocarcinoma 09/13/2011    At the hepatic flexure    has HYPERTENSION; MASS, LOCALIZED, SUPERFICIAL; CARDIAC MURMUR; CHEST PAIN; HEARTBURN; GI bleed; Anemia; Obesity; Hypokalemia; and Right hemicolectomy June 2013 for node negative adenocarcinoma on her problem list.     is allergic to chocolate; honey; penicillins; sulfonamide derivatives; aspirin; and codeine.  Vicki Woods does not currently have medications on file.  Past Surgical History  Procedure Laterality Date  . Total abdominal hysterectomy w/ bilateral salpingoophorectomy    . Esophagogastroduodenoscopy  06/20/2011    Procedure: ESOPHAGOGASTRODUODENOSCOPY (EGD);  Surgeon: Rogene Houston, MD;  [facility];  Service: Endoscopy;  Laterality: N/A;  . Colonoscopy  06/21/2011    Procedure: COLONOSCOPY;  Surgeon: Rogene Houston, MD;  [facility];  Service: Endoscopy;  Laterality: N/A;  . Abdominal hysterectomy  1963  . Hemicolectomy  09/13/2011  . Cataract extraction w/phaco Right 08/27/2012    Procedure: CATARACT EXTRACTION PHACO AND INTRAOCULAR LENS PLACEMENT (IOC);  Surgeon: Elta Guadeloupe T. Gershon Crane, MD;  Location: [facility];  Service: Ophthalmology;  Laterality: Right;  CDE:9.80    Denies any  headaches, dizziness, double vision, fevers, chills, night sweats, nausea, vomiting, diarrhea, constipation, chest pain, heart palpitations, shortness of breath, blood in stool, black tarry stool, urinary pain, urinary burning, urinary frequency, hematuria.   PHYSICAL EXAMINATION  ECOG PERFORMANCE STATUS: 0 - Asymptomatic  Filed Vitals:   03/24/13 1125  BP: 158/69  Pulse: 88  Temp: 97.9 F (36.6 C)  Resp: 18    GENERAL:alert, no distress, well nourished, well developed, comfortable, cooperative and smiling SKIN: skin color, texture, turgor are normal, no rashes or significant lesions HEAD: Normocephalic, No masses, lesions, tenderness or abnormalities EYES: normal, PERRLA, EOMI, Conjunctiva are pink and non-injected EARS: External ears normal OROPHARYNX:mucous membranes are moist  NECK: supple, trachea midline LYMPH:  not examined BREAST:not examined LUNGS: not examined HEART: not examined ABDOMEN:not examined BACK: not examined EXTREMITIES:no skin discoloration, no clubbing, no cyanosis, positive findings:  Right upper arm black, hardened lesion measuring less than 1 cm in size  NEURO: alert & oriented x 3 with fluent speech, no focal motor/sensory deficits, gait normal    LABORATORY DATA: CBC    Component Value Date/Time   WBC 8.3 11/22/2011 1644   RBC 3.73* 11/22/2011 1644   HGB 11.3* 08/22/2012 1415   HCT 33.5* 08/22/2012 1415   PLT 263 11/22/2011 1644   MCV 86.9 11/22/2011 1644   MCH 28.7 11/22/2011 1644   MCHC 33.0 11/22/2011 1644   RDW 16.4* 11/22/2011 1644   LYMPHSABS 1.0 11/22/2011 1644   MONOABS 0.2 11/22/2011 1644   EOSABS 0.0 11/22/2011 1644   BASOSABS 0.0 11/22/2011 1644      Chemistry      Component Value Date/Time   NA 145 08/22/2012 1415   K 3.8 08/22/2012 1415   CL 108 08/22/2012 1415   CO2 28 08/22/2012 1415   BUN 31* 08/22/2012 1415   CREATININE 1.06 08/22/2012 1415      Component Value Date/Time   CALCIUM 9.9 08/22/2012 1415   ALKPHOS 81 11/22/2011 1644   AST 15 11/22/2011  1644   ALT 8 11/22/2011 1644   BILITOT 0.4 11/22/2011 1644       Chemistry      Component Value Date/Time   NA 145 08/22/2012 1415   K 3.8 08/22/2012 1415   CL 108 08/22/2012 1415   CO2 28 08/22/2012 1415   BUN 31* 08/22/2012 1415   CREATININE 1.06 08/22/2012 1415      Component Value Date/Time   CALCIUM 9.9 08/22/2012 1415   ALKPHOS 81 11/22/2011 1644   AST 15 11/22/2011 1644   ALT 8 11/22/2011 1644   BILITOT 0.4 11/22/2011 1644     Lab Results  Component Value Date   CEA 1.7 11/19/2012      PENDING LABS: CBC diff, CMET, CEA    ASSESSMENT:  1. Stage II A (T3N0M0) colon cancer, S/P right hemicolectomy 09/13/2011 by Dr. Hassell Done. 16 nodes were negative no LV I was seen but she did have  perineural space invasion found and a 4.5 cm tumor. This was a grade 1 tumor, but ulcerated. 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 There is no family history of colon cancer that she is aware of. She has no children.  2. HTN  3. Chronic LE edema 4. Sebaceous cyst of right upper arm.  Patient Active Problem List   Diagnosis Date Noted  . Right hemicolectomy June 2013 for node negative adenocarcinoma 08/04/2011  . GI bleed 06/19/2011  . Anemia 06/19/2011  . Obesity 06/19/2011  . Hypokalemia 06/19/2011  . HYPERTENSION 12/22/2009  . MASS, LOCALIZED, SUPERFICIAL 12/22/2009  . CARDIAC MURMUR 12/22/2009  . CHEST PAIN 12/22/2009  . HEARTBURN 12/22/2009     PLAN:  1. I personally reviewed and went over laboratory results with the patient.  2. Follow-up with Dr. Laural Golden as directed for repeat colonoscopy. 3. Reviewed NCCN guidelines of surveillance.   4. CEA in 6 months with a CBC diff, CMET 5. NCCN guidelines recommends a CT abd/pelvis annually.  Last one that was performed was on 06/22/2011 at time of diagnosis. 6. CT abd/pelvis with contrast will be discussed at next office visit. 7. Warm compresses to right upper arm for potential sebaceous cyst. 8. Follow-up with PCP as  needed/as directed 9. Return in 6 months for follow-up   THERAPY PLAN:  We will adapt the NCCN guidelines for surveillance due to the patient's intolerance to a lot of information at one time which I experienced on our last encounter.  We will perform CEA every 6 months and a CT abd/pelvis every 1-2 years for 5 years.  I have asked her to focus on getting a colonoscopy within the next 6 months by Dr. Laural Golden. NCCN guidelines for surveillance for T3/T4, N0-2, M0 colon cancer are:   A. H+P every 3-6 months x 2 years and then every 6 months for a total of 5 years   B. CEA every 3 months x 2 years and then every 6 months for a total of 5 years   C. CT abd/pelvis annually for up to 5 years   D. Colonoscopy in 1 year except if no preoperative colonoscopy due to obstructing lesion, colonoscopy in 3-6 months.    1. If advanced adenoma, repeat in 1 year   2. If no advanced adenoma, repeat in 3 years, then every 5 years  E. PET scan not routinely recommended.   All questions were answered. The patient knows to call the clinic with any problems, questions or concerns. We can certainly see the patient much sooner if necessary.  Patient and plan discussed with Dr. Farrel Gobble and he is in agreement with the aforementioned.   Vicki Woods

## 2013-03-24 ENCOUNTER — Encounter (HOSPITAL_COMMUNITY): Payer: Self-pay | Admitting: Oncology

## 2013-03-24 ENCOUNTER — Encounter (HOSPITAL_COMMUNITY): Payer: Medicare Other | Attending: Hematology and Oncology

## 2013-03-24 ENCOUNTER — Encounter (HOSPITAL_BASED_OUTPATIENT_CLINIC_OR_DEPARTMENT_OTHER): Payer: Medicare Other | Admitting: Oncology

## 2013-03-24 VITALS — BP 158/69 | HR 88 | Temp 97.9°F | Resp 18 | Wt 160.6 lb

## 2013-03-24 DIAGNOSIS — C183 Malignant neoplasm of hepatic flexure: Secondary | ICD-10-CM

## 2013-03-24 DIAGNOSIS — C189 Malignant neoplasm of colon, unspecified: Secondary | ICD-10-CM

## 2013-03-24 DIAGNOSIS — D649 Anemia, unspecified: Secondary | ICD-10-CM

## 2013-03-24 DIAGNOSIS — I129 Hypertensive chronic kidney disease with stage 1 through stage 4 chronic kidney disease, or unspecified chronic kidney disease: Secondary | ICD-10-CM | POA: Insufficient documentation

## 2013-03-24 DIAGNOSIS — Z09 Encounter for follow-up examination after completed treatment for conditions other than malignant neoplasm: Secondary | ICD-10-CM | POA: Insufficient documentation

## 2013-03-24 DIAGNOSIS — L723 Sebaceous cyst: Secondary | ICD-10-CM

## 2013-03-24 DIAGNOSIS — N189 Chronic kidney disease, unspecified: Secondary | ICD-10-CM | POA: Insufficient documentation

## 2013-03-24 DIAGNOSIS — R609 Edema, unspecified: Secondary | ICD-10-CM

## 2013-03-24 DIAGNOSIS — Z85038 Personal history of other malignant neoplasm of large intestine: Secondary | ICD-10-CM | POA: Insufficient documentation

## 2013-03-24 LAB — COMPREHENSIVE METABOLIC PANEL
ALBUMIN: 3.9 g/dL (ref 3.5–5.2)
ALK PHOS: 74 U/L (ref 39–117)
ALT: 11 U/L (ref 0–35)
AST: 19 U/L (ref 0–37)
BILIRUBIN TOTAL: 0.3 mg/dL (ref 0.3–1.2)
BUN: 33 mg/dL — AB (ref 6–23)
CHLORIDE: 108 meq/L (ref 96–112)
CO2: 26 mEq/L (ref 19–32)
Calcium: 10.1 mg/dL (ref 8.4–10.5)
Creatinine, Ser: 1.01 mg/dL (ref 0.50–1.10)
GFR calc Af Amer: 60 mL/min — ABNORMAL LOW (ref >90)
GFR calc non Af Amer: 52 mL/min — ABNORMAL LOW (ref >90)
Glucose, Bld: 90 mg/dL (ref 70–99)
POTASSIUM: 4.1 meq/L (ref 3.7–5.3)
SODIUM: 144 meq/L (ref 137–147)
Total Protein: 7.5 g/dL (ref 6.0–8.3)

## 2013-03-24 LAB — CBC WITH DIFFERENTIAL/PLATELET
BASOS PCT: 0 % (ref 0–1)
Basophils Absolute: 0 10*3/uL (ref 0.0–0.1)
Eosinophils Absolute: 0.1 10*3/uL (ref 0.0–0.7)
Eosinophils Relative: 1 % (ref 0–5)
HCT: 33.1 % — ABNORMAL LOW (ref 36.0–46.0)
HEMOGLOBIN: 11.1 g/dL — AB (ref 12.0–15.0)
LYMPHS ABS: 1.6 10*3/uL (ref 0.7–4.0)
Lymphocytes Relative: 29 % (ref 12–46)
MCH: 29.5 pg (ref 26.0–34.0)
MCHC: 33.5 g/dL (ref 30.0–36.0)
MCV: 88 fL (ref 78.0–100.0)
MONOS PCT: 3 % (ref 3–12)
Monocytes Absolute: 0.2 10*3/uL (ref 0.1–1.0)
NEUTROS ABS: 3.6 10*3/uL (ref 1.7–7.7)
NEUTROS PCT: 66 % (ref 43–77)
Platelets: 254 10*3/uL (ref 150–400)
RBC: 3.76 MIL/uL — AB (ref 3.87–5.11)
RDW: 16.5 % — ABNORMAL HIGH (ref 11.5–15.5)
WBC: 5.4 10*3/uL (ref 4.0–10.5)

## 2013-03-24 NOTE — Progress Notes (Signed)
Labs drawn today for cbc/diff,cmp,cea 

## 2013-03-24 NOTE — Patient Instructions (Signed)
[facility] Discharge Instructions  RECOMMENDATIONS MADE BY THE CONSULTANT AND ANY TEST RESULTS WILL BE SENT TO YOUR REFERRING PHYSICIAN.  EXAM FINDINGS BY THE PHYSICIAN TODAY AND SIGNS OR SYMPTOMS TO REPORT TO CLINIC OR PRIMARY PHYSICIAN: Exam and findings as discussed by Robynn Pane, PA-C.  If any problems with your blood work we will let you know.  Use warm compresses to area on your right upper arm.  Try to get colonoscopy done before we see you again in 6 months.  Report changes in your bowel habits, blood in your bowel movement or other problems.  MEDICATIONS PRESCRIBED:  none  INSTRUCTIONS/FOLLOW-UP: Follow-up in 6 months with labs and office visit.  Thank you for choosing [facility] to provide your oncology and hematology care.  To afford each patient quality time with our providers, please arrive at least 15 minutes before your scheduled appointment time.  With your help, our goal is to use those 15 minutes to complete the necessary work-up to ensure our physicians have the information they need to help with your evaluation and healthcare recommendations.    Effective January 1st, 2014, we ask that you re-schedule your appointment with our physicians should you arrive 10 or more minutes late for your appointment.  We strive to give you quality time with our providers, and arriving late affects you and other patients whose appointments are after yours.    Again, thank you for choosing [facility].  Our hope is that these requests will decrease the amount of time that you wait before being seen by our physicians.       _____________________________________________________________  Should you have questions after your visit to [facility], please contact our office at [phone] between the hours of 8:30 a.m. and 5:00 p.m.  Voicemails left after 4:30 p.m. will not be returned until the following business day.  For  prescription refill requests, have your pharmacy contact our office with your prescription refill request.

## 2013-03-25 LAB — CEA: CEA: 2.2 ng/mL (ref 0.0–5.0)

## 2013-06-04 ENCOUNTER — Telehealth (HOSPITAL_COMMUNITY): Payer: Self-pay

## 2013-06-04 NOTE — Telephone Encounter (Signed)
Per patient she is unsure about getting colonoscopy because of the "super bug"that has occurred with them not being to get the scopes completely sterilized so wants to know if a scan may be better and less dangerous.  Recommended that she discuss that with Dr. Laural Golden.  Stated she would do so.

## 2013-07-17 ENCOUNTER — Encounter (INDEPENDENT_AMBULATORY_CARE_PROVIDER_SITE_OTHER): Payer: Self-pay | Admitting: Surgery

## 2013-08-08 ENCOUNTER — Encounter (INDEPENDENT_AMBULATORY_CARE_PROVIDER_SITE_OTHER): Payer: Self-pay | Admitting: Surgery

## 2013-08-08 ENCOUNTER — Ambulatory Visit (INDEPENDENT_AMBULATORY_CARE_PROVIDER_SITE_OTHER): Payer: Medicare Other | Admitting: Surgery

## 2013-08-08 VITALS — BP 174/80 | HR 80 | Temp 97.0°F | Resp 16 | Ht 61.5 in | Wt 153.0 lb

## 2013-08-08 DIAGNOSIS — Z85038 Personal history of other malignant neoplasm of large intestine: Secondary | ICD-10-CM

## 2013-08-08 NOTE — Patient Instructions (Signed)
Thanks for your patience.  If you need further assistance after leaving the office, please call our office and speak with a [facility] nurse.  [phone].  If you want to leave a message for Dr. Loucille Takach, please call his office phone at [phone]. 

## 2013-08-08 NOTE — Progress Notes (Signed)
Vicki Woods 80 y.o.  Body mass index is 28.44 kg/(m^2).  Patient Active Problem List   Diagnosis Date Noted  . Right hemicolectomy June 2013 for node negative adenocarcinoma 08/04/2011  . GI bleed 06/19/2011  . Anemia 06/19/2011  . Obesity 06/19/2011  . Hypokalemia 06/19/2011  . HYPERTENSION 12/22/2009  . MASS, LOCALIZED, SUPERFICIAL 12/22/2009  . CARDIAC MURMUR 12/22/2009  . CHEST PAIN 12/22/2009  . HEARTBURN 12/22/2009    Allergies  Allergen Reactions  . Chocolate Shortness Of Breath and Swelling  . Honey Shortness Of Breath  . Penicillins Anaphylaxis  . Sulfonamide Derivatives Anaphylaxis  . Aspirin     Former doctor told patient to stop due to allergy shots she was receiving.  . Codeine Anxiety    Past Surgical History  Procedure Laterality Date  . Total abdominal hysterectomy w/ bilateral salpingoophorectomy    . Esophagogastroduodenoscopy  06/20/2011    Procedure: ESOPHAGOGASTRODUODENOSCOPY (EGD);  Surgeon: Rogene Houston, MD;  [facility];  Service: Endoscopy;  Laterality: N/A;  . Colonoscopy  06/21/2011    Procedure: COLONOSCOPY;  Surgeon: Rogene Houston, MD;  [facility];  Service: Endoscopy;  Laterality: N/A;  . Abdominal hysterectomy  1963  . Hemicolectomy  09/13/2011  . Cataract extraction w/phaco Right 08/27/2012    Procedure: CATARACT EXTRACTION PHACO AND INTRAOCULAR LENS PLACEMENT (IOC);  Surgeon: Elta Guadeloupe T. Gershon Crane, MD;  Location: [facility];  Service: Ophthalmology;  Laterality: Right;  CDE:9.80   [name], MD No diagnosis found.  2 years out from her colectomy.  She had a sore spot at the upper limits of her incision.  No obvious hernia.  She is scheduled to have a colonoscopy per Dr. Laural Golden next month.  If that is OK then I can see her back in 1 year.   Matt B. Hassell Done, MD, [facility] [phone] beeper [phone]  08/08/2013 11:21 AM

## 2013-08-28 ENCOUNTER — Telehealth (INDEPENDENT_AMBULATORY_CARE_PROVIDER_SITE_OTHER): Payer: Self-pay | Admitting: *Deleted

## 2013-08-28 NOTE — Telephone Encounter (Signed)
If hospital is going to charge her I cant help it; She needs to hire someone for two days and that might be cheaper.

## 2013-08-28 NOTE — Telephone Encounter (Signed)
I feel I can talk to anyone but this was  very difficult to talk to her because she will not let you speak.  She is insistent that Dr. Laural Golden told her he could not put  her in the hospital but she could be prepped while in [facility] and could stay until someone can pick her up.  I tried to get a word in but was very difficult as she said I was not listening to her.  I tried to explain that we had no control of charges at the hospital and I would call to find out the answer but she said she didn't care as Dr. Laural Golden who is her doctor told her that she would not be charged.  She said we do not give messages to Dr. Laural Golden and he needed to call her and not [place] to speak to her and again she didn't care what the hospital had to say she knows what he said.  I did call Day Surgery as I said I  Would and spoke to Miller.  She said that she was sure that the pre op would be a higher charge for prepping there and the recovery room would after first hour be more of a charge.  She said she would check with Elmyra Ricks and make sure she is right and call Lelon Frohlich tomorrow.  She also said Friday's for this would be better.  Also wanted to make sure she is ambulatory.  Since Ms. Salle hung up on me but she was finished talking, I will not return her call until instructed otherwise.    Please call her if you feel appropriate but if you don't she is going to fuss at all of [place].  Again she is very hard to speak to her as she stops your conversation and says I am speaking and listen to me and when you say let me explain--she will not let you do so.

## 2013-09-01 NOTE — Telephone Encounter (Signed)
Noted--will wait for patient to call back to schedule.  Vicki Woods was a witness to my conversation with her as to make sure she did not put words in my mouth about any thing we spoke about.  Patient was made aware

## 2013-09-02 ENCOUNTER — Telehealth (INDEPENDENT_AMBULATORY_CARE_PROVIDER_SITE_OTHER): Payer: Self-pay | Admitting: *Deleted

## 2013-09-02 ENCOUNTER — Encounter (INDEPENDENT_AMBULATORY_CARE_PROVIDER_SITE_OTHER): Payer: Self-pay | Admitting: *Deleted

## 2013-09-02 ENCOUNTER — Other Ambulatory Visit (INDEPENDENT_AMBULATORY_CARE_PROVIDER_SITE_OTHER): Payer: Self-pay | Admitting: *Deleted

## 2013-09-02 DIAGNOSIS — Z86010 Personal history of colonic polyps: Secondary | ICD-10-CM

## 2013-09-02 DIAGNOSIS — Z1211 Encounter for screening for malignant neoplasm of colon: Secondary | ICD-10-CM

## 2013-09-02 MED ORDER — PEG-KCL-NACL-NASULF-NA ASC-C 100 G PO SOLR: 1.0000 | Freq: Once | ORAL | Status: DC

## 2013-09-02 NOTE — Telephone Encounter (Signed)
Patient needs movi prep 

## 2013-09-02 NOTE — Telephone Encounter (Signed)
  Procedure: tcs  Reason/Indication:  Hx polyps  Has patient had this procedure before?  Yes, 2013 -- epic  If so, when, by whom and where?    Is there a family history of colon cancer?  no  Who?  What age when diagnosed?    Is patient diabetic?   no      Does patient have prosthetic heart valve?  no  Do you have a pacemaker?  no  Has patient ever had endocarditis? no  Has patient had joint replacement within last 12 months?  no  Does patient tend to be constipated or take laxatives? no  Is patient on Coumadin, Plavix and/or Aspirin? no  Medications: see EPIC  Allergies: see EPIC  Medication Adjustment:   Procedure date & time: 09/12/13 at 1240

## 2013-09-03 ENCOUNTER — Encounter (HOSPITAL_COMMUNITY): Payer: Self-pay | Admitting: Pharmacy Technician

## 2013-09-08 NOTE — Telephone Encounter (Signed)
agree

## 2013-09-12 ENCOUNTER — Encounter (HOSPITAL_COMMUNITY)
Admission: RE | Disposition: A | Discharge status: Home / Self Care | Payer: Self-pay | Source: Ambulatory Visit | Attending: Internal Medicine

## 2013-09-12 ENCOUNTER — Encounter (HOSPITAL_COMMUNITY): Payer: Self-pay | Admitting: *Deleted

## 2013-09-12 ENCOUNTER — Ambulatory Visit (HOSPITAL_COMMUNITY)
Admission: RE | Admit: 2013-09-12 | Discharge: 2013-09-12 | Disposition: A | Discharge status: Home / Self Care | Payer: Medicare Other | Source: Ambulatory Visit | Attending: Internal Medicine | Admitting: Internal Medicine

## 2013-09-12 DIAGNOSIS — Z86010 Personal history of colonic polyps: Secondary | ICD-10-CM

## 2013-09-12 DIAGNOSIS — K219 Gastro-esophageal reflux disease without esophagitis: Secondary | ICD-10-CM | POA: Insufficient documentation

## 2013-09-12 DIAGNOSIS — Z09 Encounter for follow-up examination after completed treatment for conditions other than malignant neoplasm: Secondary | ICD-10-CM | POA: Insufficient documentation

## 2013-09-12 DIAGNOSIS — N189 Chronic kidney disease, unspecified: Secondary | ICD-10-CM | POA: Insufficient documentation

## 2013-09-12 DIAGNOSIS — Z85038 Personal history of other malignant neoplasm of large intestine: Secondary | ICD-10-CM

## 2013-09-12 DIAGNOSIS — Z9049 Acquired absence of other specified parts of digestive tract: Secondary | ICD-10-CM | POA: Insufficient documentation

## 2013-09-12 DIAGNOSIS — I129 Hypertensive chronic kidney disease with stage 1 through stage 4 chronic kidney disease, or unspecified chronic kidney disease: Secondary | ICD-10-CM | POA: Insufficient documentation

## 2013-09-12 DIAGNOSIS — D126 Benign neoplasm of colon, unspecified: Secondary | ICD-10-CM | POA: Insufficient documentation

## 2013-09-12 DIAGNOSIS — Z79899 Other long term (current) drug therapy: Secondary | ICD-10-CM | POA: Insufficient documentation

## 2013-09-12 DIAGNOSIS — K5730 Diverticulosis of large intestine without perforation or abscess without bleeding: Secondary | ICD-10-CM | POA: Insufficient documentation

## 2013-09-12 HISTORY — PX: COLONOSCOPY: SHX5424

## 2013-09-12 SURGERY — COLONOSCOPY
Anesthesia: Moderate Sedation

## 2013-09-12 MED ORDER — MEPERIDINE HCL 50 MG/ML IJ SOLN
INTRAMUSCULAR | Status: AC
  Filled 2013-09-12: qty 1

## 2013-09-12 MED ORDER — MEPERIDINE HCL 50 MG/ML IJ SOLN
INTRAMUSCULAR | Status: DC | PRN
  Administered 2013-09-12: 25 mg via INTRAVENOUS

## 2013-09-12 MED ORDER — STERILE WATER FOR IRRIGATION IR SOLN
Status: DC | PRN
  Administered 2013-09-12: 13:00:00

## 2013-09-12 MED ORDER — MIDAZOLAM HCL 5 MG/5ML IJ SOLN
INTRAMUSCULAR | Status: AC
  Filled 2013-09-12: qty 10

## 2013-09-12 MED ORDER — SODIUM CHLORIDE 0.9 % IV SOLN
INTRAVENOUS | Status: DC
  Administered 2013-09-12: 12:00:00 via INTRAVENOUS

## 2013-09-12 MED ORDER — MIDAZOLAM HCL 5 MG/5ML IJ SOLN
INTRAMUSCULAR | Status: DC | PRN
  Administered 2013-09-12: 1 mg via INTRAVENOUS
  Administered 2013-09-12: 2 mg via INTRAVENOUS

## 2013-09-12 NOTE — Discharge Instructions (Signed)
Resume usual medications and diet. °No driving for 24 hours. °Physician will call with biopsy results. ° °Colonoscopy, Care After °Refer to this sheet in the next few weeks. These instructions provide you with information on caring for yourself after your procedure. Your health care provider may also give you more specific instructions. Your treatment has been planned according to current medical practices, but problems sometimes occur. Call your health care provider if you have any problems or questions after your procedure. °WHAT TO EXPECT AFTER THE PROCEDURE  °After your procedure, it is typical to have the following: °· A small amount of blood in your stool. °· Moderate amounts of gas and mild abdominal cramping or bloating. °HOME CARE INSTRUCTIONS °· Do not drive, operate machinery, or sign important documents for 24 hours. °· You may shower and resume your regular physical activities, but move at a slower pace for the first 24 hours. °· Take frequent rest periods for the first 24 hours. °· Walk around or put a warm pack on your abdomen to help reduce abdominal cramping and bloating. °· Drink enough fluids to keep your urine clear or pale yellow. °· You may resume your normal diet as instructed by your health care provider. Avoid heavy or fried foods that are hard to digest. °· Avoid drinking alcohol for 24 hours or as instructed by your health care provider. °· Only take over-the-counter or prescription medicines as directed by your health care provider. °· If a tissue sample (biopsy) was taken during your procedure: °¨ Do not take aspirin or blood thinners for 7 days, or as instructed by your health care provider. °¨ Do not drink alcohol for 7 days, or as instructed by your health care provider. °¨ Eat soft foods for the first 24 hours. °SEEK MEDICAL CARE IF: °You have persistent spotting of blood in your stool 2-3 days after the procedure. °SEEK IMMEDIATE MEDICAL CARE IF: °· You have more than a small  spotting of blood in your stool. °· You pass large blood clots in your stool. °· Your abdomen is swollen (distended). °· You have nausea or vomiting. °· You have a fever. °· You have increasing abdominal pain that is not relieved with medicine. °Document Released: 10/19/2003 Document Revised: 12/25/2012 Document Reviewed: 11/11/2012 °ExitCare® Patient Information ©2015 [employer]. This information is not intended to replace advice given to you by your health care provider. Make sure you discuss any questions you have with your health care provider. ° °

## 2013-09-12 NOTE — Op Note (Signed)
COLONOSCOPY PROCEDURE REPORT  PATIENT:  Vicki Woods  MR#:  [id number] Birthdate:  Dec 30, 1931, 80 y.o., female Endoscopist:  Dr. Rogene Houston, MD Referred By:  Dr. [name], MD  Procedure Date: 09/12/2013  Procedure:   Colonoscopy  Indications: Patient is an 80 year-old [place] female with history of colon carcinoma who is here for surveillance colonoscopy. She is status post right hemicolectomy for stage II or T3 N0 M0 lesion in June 2013. She did not require adjuvant chemotherapy. She has no GI symptoms and she is here for surveillance colonoscopy.  Informed Consent:  The procedure and risks were reviewed with the patient and informed consent was obtained.  Medications:  Demerol 25 mg IV Versed 3 mg IV  Description of procedure:  After a digital rectal exam was performed, that colonoscope was advanced from the anus through the rectum and colon to the area of anastomosis located in the region of proximal transverse colon. As the scope was slowly and cautiously withdrawn,mucosal surfaces were carefully surveyed utilizing scope tip to flexion to facilitate fold flattening as needed. The scope was pulled down into the rectum where a thorough exam including retroflexion was performed.  Findings:   Prep excellent. Wide-open ileocolonic anastomosis. Two small polyps were cold snared from transverse colon distal to anastomosis. These polyps were submitted together. Another small polyp was cold snare from distal sigmoid colon but was lost. Scattered diverticula in descending and sigmoid colon. Normal rectal mucosa and anal rectal junction.    Therapeutic/Diagnostic Maneuvers Performed:  See above  Complications:  None  Colon Withdrawal Time:  14 minutes  Impression:  Wide-open ileocolonic anastomosis located in the region of proximal transverse colon. Two small polyps were cold snared from transverse colon and submitted together. Third polyp was cold snared from  distal sigmoid colon but was lost. Left-sided colonic diverticulosis.  Recommendations:  Standard instructions given. I will contact patient with biopsy results. She may consider next exam in 3 years.  REHMAN,NAJEEB U  09/12/2013 1:28 PM  CC: Dr. Hilma Favors, Betsy Coder, MD & Dr. Rayne Du ref. provider found CC: Dr. Rodman Key B. Martin,MD CC: Mr. Robynn Pane, [facility]

## 2013-09-12 NOTE — H&P (Signed)
Vicki Woods is an 80 y.o. female.   Chief Complaint: Patient is here for colonoscopy. 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 She did not require adjuvant therapy. She remains in remission. She is here for surveillance exam. She denies abdominal pain or rectal bleeding. Family history is negative for CRC.  Past Medical History  Diagnosis Date  . Hypertension   . GERD (gastroesophageal reflux disease)   . Proteinuria   . Headache(784.0)   . Arthritis     knees,  shoulders  . Leg swelling   . Rectal bleeding   . Weakness   . Blood in stool   . Swelling     in legs   . Chronic kidney disease     chronic kidney disease   . Adenocarcinoma 09/13/2011    At the hepatic flexure    Past Surgical History  Procedure Laterality Date  . Total abdominal hysterectomy w/ bilateral salpingoophorectomy    . Esophagogastroduodenoscopy  06/20/2011    Procedure: ESOPHAGOGASTRODUODENOSCOPY (EGD);  Surgeon: Rogene Houston, MD;  [facility];  Service: Endoscopy;  Laterality: N/A;  . Colonoscopy  06/21/2011    Procedure: COLONOSCOPY;  Surgeon: Rogene Houston, MD;  Location: [facility];  Service: Endoscopy;  Laterality: N/A;  . Abdominal hysterectomy  1963  . Hemicolectomy  09/13/2011  . Cataract extraction w/phaco Right 08/27/2012    Procedure: CATARACT EXTRACTION PHACO AND INTRAOCULAR LENS PLACEMENT (IOC);  Surgeon: Elta Guadeloupe T. Gershon Crane, MD;  Location: [facility];  Service: Ophthalmology;  Laterality: Right;  CDE:9.80    Family History  Problem Relation Age of Onset  . Ovarian cancer Mother   . Lung cancer Brother    Social History:  reports that she has never smoked. She has never used smokeless tobacco. She reports that she does not drink alcohol or use illicit drugs.  Allergies:  Allergies  Allergen Reactions  . Chocolate Shortness Of Breath  and Swelling  . Honey Shortness Of Breath  . Penicillins Anaphylaxis  . Sulfonamide Derivatives Anaphylaxis  . Aspirin     Former doctor told patient to stop due to allergy shots she was receiving.  . Codeine Anxiety    Medications Prior to Admission  Medication Sig Dispense Refill  . acetaminophen (TYLENOL) 500 MG tablet Take 500 mg by mouth every 4 (four) hours as needed. For pain      . amLODipine (NORVASC) 10 MG tablet Take 2.5 mg by mouth daily with breakfast. Patient takes at various times of day depending on blood pressure      . cholecalciferol (VITAMIN D) 1000 UNITS tablet Take 1,000 Units by mouth daily.      . furosemide (LASIX) 40 MG tablet Take 40 mg by mouth.      . losartan-hydrochlorothiazide (HYZAAR) 100-25 MG per tablet Take 0.5 tablets by mouth daily. Patient takes around 1100am or 1200noon      . polyvinyl alcohol (LIQUID TEARS) 1.4 % ophthalmic solution Place 2 drops into both eyes daily as needed. For dry eyes        No results found for this or any previous visit (from the past 48 hour(s)). No results found.  ROS  Blood pressure 178/65, pulse 95, temperature 98 F (36.7 C), temperature source Oral, resp. rate 20, SpO2 100.00%. Physical Exam  Constitutional: She appears well-developed and well-nourished.  HENT:  Mouth/Throat: Oropharynx is clear and  moist.  Eyes: Conjunctivae are normal. No scleral icterus.  Neck: No thyromegaly present.  Cardiovascular: Normal rate and regular rhythm.   No murmur heard. Respiratory: Effort normal and breath sounds normal.  GI: Soft. She exhibits no distension and no mass. There is no tenderness.  Small midline scar in mid abdomen  Musculoskeletal: Edema: nonpitting edema involving both legs   Lymphadenopathy:    She has no cervical adenopathy.  Neurological: She is alert.  Skin: Skin is warm and dry.     Assessment/Plan History of colon carcinoma and colonic adenomas. Surveillance colonoscopy.  REHMAN,NAJEEB  U 09/12/2013, 12:53 PM

## 2013-09-15 ENCOUNTER — Encounter (HOSPITAL_COMMUNITY): Payer: Self-pay | Admitting: Internal Medicine

## 2013-09-19 NOTE — Progress Notes (Signed)
[name], Vicki Woods [id number]  Right hemicolectomy June 2013 for node negative adenocarcinoma  CURRENT THERAPY: Surveillance per NCCN guidelines  INTERVAL HISTORY: Vicki Woods 80 y.o. female returns for  regular  visit for followup of Stage II A (T3N0M0) colon cancer, S/P right hemicolectomy 09/13/2011 by Dr. Hassell Done. 16 nodes were negative no LV I was seen but she did have perineural space invasion found and a 4.5 cm tumor. This was a grade 1 tumor, but ulcerated. 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 There is no family history of colon cancer that she is aware of. She has no children.   Janaiah underwent colonoscopy by Dr. Laural Golden on 09/12/2013.  Per op note: Wide-open ileocolonic anastomosis located in the region of proximal transverse colon.  Two small polyps were cold snared from transverse colon and submitted together.  Third polyp was cold snared from distal sigmoid colon but was lost.  Left-sided colonic diverticulosis.  I personally reviewed and went over pathology results with the patient. And they are negative for malignancy.  According to NCCN guidelines, she is to have a CT scan every year x 5 years, but she has not had one since April 2013.  She is not sure she wants any more done and that is ok.  I have asked her to consider this surveillance tool and we will talk about it further when she returns.    I personally reviewed and went over laboratory results with the patient.  The results are noted within this dictation.  She is noted to have Lasix and Losartan + HCTZ.  Her K+ is just barely low and I will correct it with K-dur.   She is educated that these two medications will spare K+ and this will likely need to be addressed by her PCP as she may benefit from K+ supplementation while she is on these medications.  Oncologically, she denies any complaints and ROS questioning is negative.    Her  AC went out a little while back, but that has been fixed.    Past Medical History  Diagnosis Date  . Hypertension   . GERD (gastroesophageal reflux disease)   . Proteinuria   . Headache(784.0)   . Arthritis     knees,  shoulders  . Leg swelling   . Rectal bleeding   . Weakness   . Blood in stool   . Swelling     in legs   . Chronic kidney disease     chronic kidney disease   . Adenocarcinoma 09/13/2011    At the hepatic flexure    has HYPERTENSION; MASS, LOCALIZED, SUPERFICIAL; CARDIAC MURMUR; CHEST PAIN; HEARTBURN; GI bleed; Anemia; Obesity; Hypokalemia; and Right hemicolectomy June 2013 for node negative adenocarcinoma on her problem list.     is allergic to chocolate; honey; onion; penicillins; sulfonamide derivatives; aspirin; and codeine.  Ms. Peaden does not currently have medications on file.  Past Surgical History  Procedure Laterality Date  . Total abdominal hysterectomy w/ bilateral salpingoophorectomy    . Esophagogastroduodenoscopy  06/20/2011    Procedure: ESOPHAGOGASTRODUODENOSCOPY (EGD);  Surgeon: Rogene Houston, MD;  [facility];  Service: Endoscopy;  Laterality: N/A;  . Colonoscopy  06/21/2011    Procedure: COLONOSCOPY;  Surgeon: Rogene Houston, MD;  [facility];  Service: Endoscopy;  Laterality: N/A;  . Abdominal hysterectomy  1963  . Hemicolectomy  09/13/2011  . Cataract extraction w/phaco Right  08/27/2012    Procedure: CATARACT EXTRACTION PHACO AND INTRAOCULAR LENS PLACEMENT (IOC);  Surgeon: Elta Guadeloupe T. Gershon Crane, MD;  Location: [facility];  Service: Ophthalmology;  Laterality: Right;  CDE:9.80  . Colonoscopy N/A 09/12/2013    Procedure: COLONOSCOPY;  Surgeon: Rogene Houston, MD;  [facility]: [facility];  Service: Endoscopy;  Laterality: N/A;  1240    Denies any headaches, dizziness, double vision, fevers, chills, night sweats, nausea, vomiting, diarrhea, constipation, chest pain, heart palpitations, shortness of breath, blood in stool, black  tarry stool, urinary pain, urinary burning, urinary frequency, hematuria.   PHYSICAL EXAMINATION  ECOG PERFORMANCE STATUS: 1 - Symptomatic but completely ambulatory  Filed Vitals:   09/22/13 0910  BP: 174/61  Pulse: 88  Temp: 98 F (36.7 C)  Resp: 20    GENERAL:alert, no distress, well nourished, well developed, comfortable, cooperative and smiling SKIN: skin color, texture, turgor are normal, no rashes or significant lesions HEAD: Normocephalic, No masses, lesions, tenderness or abnormalities EYES: normal, PERRLA, EOMI, Conjunctiva are pink and non-injected EARS: External ears normal OROPHARYNX:mucous membranes are moist  NECK: supple, no adenopathy, thyroid normal size, non-tender, without nodularity, no stridor, non-tender, trachea midline LYMPH:  no palpable lymphadenopathy BREAST:not examined LUNGS: clear to auscultation  HEART: regular rate & rhythm, no murmurs and no gallops ABDOMEN:abdomen soft and normal bowel sounds BACK: Back symmetric, no curvature. EXTREMITIES:less then 2 second capillary refill, no joint deformities, effusion, or inflammation, no clubbing, no cyanosis  NEURO: alert & oriented x 3 with fluent speech, no focal motor/sensory deficits, gait normal   LABORATORY DATA: CBC    Component Value Date/Time   WBC 5.8 09/22/2013 0854   RBC 3.66* 09/22/2013 0854   HGB 10.8* 09/22/2013 0854   HCT 31.6* 09/22/2013 0854   PLT 217 09/22/2013 0854   MCV 86.3 09/22/2013 0854   MCH 29.5 09/22/2013 0854   MCHC 34.2 09/22/2013 0854   RDW 16.5* 09/22/2013 0854   LYMPHSABS 1.8 09/22/2013 0854   MONOABS 0.3 09/22/2013 0854   EOSABS 0.1 09/22/2013 0854   BASOSABS 0.0 09/22/2013 0854      Chemistry      Component Value Date/Time   NA 146 09/22/2013 0854   K 3.5* 09/22/2013 0854   CL 108 09/22/2013 0854   CO2 26 09/22/2013 0854   BUN 26* 09/22/2013 0854   CREATININE 1.02 09/22/2013 0854      Component Value Date/Time   CALCIUM 9.9 09/22/2013 0854   ALKPHOS 68 09/22/2013 0854   AST 15 09/22/2013  0854   ALT 9 09/22/2013 0854   BILITOT 0.4 09/22/2013 0854       Chemistry      Component Value Date/Time   NA 146 09/22/2013 0854   K 3.5* 09/22/2013 0854   CL 108 09/22/2013 0854   CO2 26 09/22/2013 0854   BUN 26* 09/22/2013 0854   CREATININE 1.02 09/22/2013 0854      Component Value Date/Time   CALCIUM 9.9 09/22/2013 0854   ALKPHOS 68 09/22/2013 0854   AST 15 09/22/2013 0854   ALT 9 09/22/2013 0854   BILITOT 0.4 09/22/2013 0854     Lab Results  Component Value Date   CEA 2.2 03/24/2013     PATHOLOGY:  09/12/2013  Diagnosis Colon, polyp(s), transverse TUBULAR ADENOMA. NO HIGH GRADE DYSPLASIA OR MALIGNANCY IDENTIFIED. (TWO FRAGMENTS) Aldona Bar MD Pathologist, Electronic Signature (Case signed 09/16/2013)    ASSESSMENT:  1. Stage II A (T3N0M0) colon cancer, S/P right hemicolectomy 09/13/2011 by Dr. Hassell Done.  16 nodes were negative no LV I was seen but she did have perineural space invasion found and a 4.5 cm tumor. This was a grade 1 tumor, but ulcerated. 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 There is no family history of colon cancer that she is aware of. She has no children.  2. HTN  3. Chronic LE edema 4. Next colonoscopy in June 2018 5. Hypokalemia  Patient Active Problem List   Diagnosis Date Noted  . Right hemicolectomy June 2013 for node negative adenocarcinoma 08/04/2011  . GI bleed 06/19/2011  . Anemia 06/19/2011  . Obesity 06/19/2011  . Hypokalemia 06/19/2011  . HYPERTENSION 12/22/2009  . MASS, LOCALIZED, SUPERFICIAL 12/22/2009  . CARDIAC MURMUR 12/22/2009  . CHEST PAIN 12/22/2009  . HEARTBURN 12/22/2009     PLAN:  1. I personally reviewed and went over laboratory results with the patient.  The results are noted within this dictation. 2. I personally reviewed and went over pathology results with the patient. 3. Reviewed NCCN guidelines of surveillance.  4. Labs today: CBC diff, CMET, CEA 5. NCCN guidelines recommends a CT  abd/pelvis annually. Last one that was performed was on 06/22/2011 at time of diagnosis.  We discussed this surveillance option and she will consider undergoing one in the future. 6. Labs in 6 months: CBC diff, CMET, CEA 7. Kdur 20 mEq daily x 30 days.  Future refills from PCP if necessary. 8. Return in 6 months for follow-up   THERAPY PLAN:  NCCN guidelines for surveillance for T3/T4, N0-2, M0 colon cancer are:   A. H+P every 3-6 months x 2 years and then every 6 months for a total of 5 years   B. CEA every 3 months x 2 years and then every 6 months for a total of 5 years   C. CT abd/pelvis annually for up to 5 years   D. Colonoscopy in 1 year except if no preoperative colonoscopy due to obstructing lesion, colonoscopy in 3-6 months.    1. If advanced adenoma, repeat in 1 year   2. If no advanced adenoma, repeat in 3 years, then every 5 years  E. PET scan not routinely recommended.  All questions were answered. The patient knows to call the clinic with any problems, questions or concerns. We can certainly see the patient much sooner if necessary.  Patient and plan discussed with Dr. Farrel Gobble and he is in agreement with the aforementioned.   KEFALAS,THOMAS 09/22/2013

## 2013-09-22 ENCOUNTER — Encounter (HOSPITAL_COMMUNITY): Payer: Medicare Other | Attending: Oncology | Admitting: Oncology

## 2013-09-22 ENCOUNTER — Encounter (HOSPITAL_BASED_OUTPATIENT_CLINIC_OR_DEPARTMENT_OTHER): Payer: Medicare Other

## 2013-09-22 ENCOUNTER — Encounter (HOSPITAL_COMMUNITY): Payer: Self-pay | Admitting: Oncology

## 2013-09-22 VITALS — BP 174/61 | HR 88 | Temp 98.0°F | Resp 20 | Wt 151.9 lb

## 2013-09-22 DIAGNOSIS — E876 Hypokalemia: Secondary | ICD-10-CM | POA: Diagnosis not present

## 2013-09-22 DIAGNOSIS — Z860100 Personal history of colon polyps, unspecified: Secondary | ICD-10-CM | POA: Insufficient documentation

## 2013-09-22 DIAGNOSIS — I10 Essential (primary) hypertension: Secondary | ICD-10-CM | POA: Diagnosis not present

## 2013-09-22 DIAGNOSIS — R609 Edema, unspecified: Secondary | ICD-10-CM | POA: Insufficient documentation

## 2013-09-22 DIAGNOSIS — C189 Malignant neoplasm of colon, unspecified: Secondary | ICD-10-CM

## 2013-09-22 DIAGNOSIS — C184 Malignant neoplasm of transverse colon: Secondary | ICD-10-CM

## 2013-09-22 DIAGNOSIS — Z9049 Acquired absence of other specified parts of digestive tract: Secondary | ICD-10-CM | POA: Diagnosis not present

## 2013-09-22 DIAGNOSIS — Z85038 Personal history of other malignant neoplasm of large intestine: Secondary | ICD-10-CM | POA: Diagnosis present

## 2013-09-22 LAB — COMPREHENSIVE METABOLIC PANEL
ALT: 9 U/L (ref 0–35)
AST: 15 U/L (ref 0–37)
Albumin: 3.7 g/dL (ref 3.5–5.2)
Alkaline Phosphatase: 68 U/L (ref 39–117)
Anion gap: 12 (ref 5–15)
BUN: 26 mg/dL — ABNORMAL HIGH (ref 6–23)
CALCIUM: 9.9 mg/dL (ref 8.4–10.5)
CO2: 26 mEq/L (ref 19–32)
Chloride: 108 mEq/L (ref 96–112)
Creatinine, Ser: 1.02 mg/dL (ref 0.50–1.10)
GFR calc Af Amer: 59 mL/min — ABNORMAL LOW (ref >90)
GFR calc non Af Amer: 51 mL/min — ABNORMAL LOW (ref >90)
Glucose, Bld: 99 mg/dL (ref 70–99)
Potassium: 3.5 mEq/L — ABNORMAL LOW (ref 3.7–5.3)
SODIUM: 146 meq/L (ref 137–147)
TOTAL PROTEIN: 7.1 g/dL (ref 6.0–8.3)
Total Bilirubin: 0.4 mg/dL (ref 0.3–1.2)

## 2013-09-22 LAB — CBC WITH DIFFERENTIAL/PLATELET
BASOS PCT: 0 % (ref 0–1)
Basophils Absolute: 0 10*3/uL (ref 0.0–0.1)
EOS ABS: 0.1 10*3/uL (ref 0.0–0.7)
Eosinophils Relative: 1 % (ref 0–5)
HCT: 31.6 % — ABNORMAL LOW (ref 36.0–46.0)
Hemoglobin: 10.8 g/dL — ABNORMAL LOW (ref 12.0–15.0)
Lymphocytes Relative: 30 % (ref 12–46)
Lymphs Abs: 1.8 10*3/uL (ref 0.7–4.0)
MCH: 29.5 pg (ref 26.0–34.0)
MCHC: 34.2 g/dL (ref 30.0–36.0)
MCV: 86.3 fL (ref 78.0–100.0)
Monocytes Absolute: 0.3 10*3/uL (ref 0.1–1.0)
Monocytes Relative: 5 % (ref 3–12)
Neutro Abs: 3.7 10*3/uL (ref 1.7–7.7)
Neutrophils Relative %: 64 % (ref 43–77)
PLATELETS: 217 10*3/uL (ref 150–400)
RBC: 3.66 MIL/uL — ABNORMAL LOW (ref 3.87–5.11)
RDW: 16.5 % — AB (ref 11.5–15.5)
WBC: 5.8 10*3/uL (ref 4.0–10.5)

## 2013-09-22 LAB — CEA: CEA: 2.2 ng/mL (ref 0.0–5.0)

## 2013-09-22 MED ORDER — POTASSIUM CHLORIDE CRYS ER 20 MEQ PO TBCR: 20.0000 meq | EXTENDED_RELEASE_TABLET | Freq: Every day | ORAL | Status: DC

## 2013-09-22 NOTE — Progress Notes (Signed)
LABS DRAWN FOR, CBCD, CMP, CEA.

## 2013-09-22 NOTE — Patient Instructions (Signed)
[facility] Discharge Instructions  RECOMMENDATIONS MADE BY THE CONSULTANT AND ANY TEST RESULTS WILL BE SENT TO YOUR REFERRING PHYSICIAN.  EXAM FINDINGS BY THE PHYSICIAN TODAY AND SIGNS OR SYMPTOMS TO REPORT TO CLINIC OR PRIMARY PHYSICIAN: Exam and findings as discussed by Robynn Pane, PA-C.  You are doing well.  Report changes in bowel habits, blood in your stool, unexplained weight loss, etc.  MEDICATIONS PRESCRIBED:  none  INSTRUCTIONS/FOLLOW-UP: Follow-up in 6 months with labs and office visit.  Thank you for choosing [facility] to provide your oncology and hematology care.  To afford each patient quality time with our providers, please arrive at least 15 minutes before your scheduled appointment time.  With your help, our goal is to use those 15 minutes to complete the necessary work-up to ensure our physicians have the information they need to help with your evaluation and healthcare recommendations.    Effective January 1st, 2014, we ask that you re-schedule your appointment with our physicians should you arrive 10 or more minutes late for your appointment.  We strive to give you quality time with our providers, and arriving late affects you and other patients whose appointments are after yours.    Again, thank you for choosing [facility].  Our hope is that these requests will decrease the amount of time that you wait before being seen by our physicians.       _____________________________________________________________  Should you have questions after your visit to [facility], please contact our office at [phone] between the hours of 8:30 a.m. and 4:30 p.m.  Voicemails left after 4:30 p.m. will not be returned until the following business day.  For prescription refill requests, have your pharmacy contact our office with your prescription refill request.     _______________________________________________________________  We hope that we have given you very good care.  You may receive a patient satisfaction survey in the mail, please complete it and return it as soon as possible.  We value your feedback!  _______________________________________________________________  Have you asked about our STAR program?  STAR stands for Survivorship Training and Rehabilitation, and this is a nationally recognized cancer care program that focuses on survivorship and rehabilitation.  Cancer and cancer treatments may cause problems, such as, pain, making you feel tired and keeping you from doing the things that you need or want to do. Cancer rehabilitation can help. Our goal is to reduce these troubling effects and help you have the best quality of life possible.  You may receive a survey from a nurse that asks questions about your current state of health.  Based on the survey results, all eligible patients will be referred to the [facility] program for an evaluation so we can better serve you!  A frequently asked questions sheet is available upon request.

## 2013-09-29 ENCOUNTER — Encounter (INDEPENDENT_AMBULATORY_CARE_PROVIDER_SITE_OTHER): Payer: Self-pay | Admitting: *Deleted

## 2014-03-18 ENCOUNTER — Other Ambulatory Visit (HOSPITAL_COMMUNITY): Payer: Self-pay

## 2014-03-18 DIAGNOSIS — C189 Malignant neoplasm of colon, unspecified: Secondary | ICD-10-CM

## 2014-03-22 NOTE — Progress Notes (Signed)
[place], [place] [address]  Right hemicolectomy June 2013 for node negative adenocarcinoma - Plan: CBC with Differential, Comprehensive metabolic panel, CEA  Anemia, unspecified anemia type - Plan: Vitamin B12, Folate, Iron and TIBC, Ferritin, CBC with Differential  CURRENT THERAPY: Surveillance per NCCN guidelines  INTERVAL HISTORY: Vicki Woods 81 y.o. female returns for followup of Stage II A (T3N0M0) colon cancer, S/P right hemicolectomy 09/13/2011 by Dr. Hassell Done. 16 nodes were negative no LV I was seen but she did have perineural space invasion found and a 4.5 cm tumor. This was a grade 1 tumor, but ulcerated. 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 There is no family history of colon cancer that she is aware of. She has no children.      Right hemicolectomy June 2013 for node negative adenocarcinoma   06/21/2011 Initial Diagnosis Colonoscopy by Dr. Laural Golden- hepatic flexure polypectomy reveals adenocarcinoma arising from tubular adenoma.   06/22/2011 Imaging CT- Minimal amount of fluid in Morison's pouch and small amount of fluid within the pelvis.  Etiology indeterminate.  Too small to characterize liver lesions possibly 2 small cysts although metastatic lesions not ruled out.     09/13/2011 Definitive Surgery R ascending colon resection by Dr. Johnathan Hausen demonstraing an ulcerative, invasive, well-differentiated adenocarcinoma measuring 4.5 cm.  No LVI, but perineural invasion noted.  0/16 lymph nodes.  Clear margins.   09/12/2013 Survivorship Colonoscopy by Dr. Laural Golden- tubular adenoma, not dysplasia or malignancy.    I personally reviewed and went over laboratory results with the patient.  The results are noted within this dictation.  According to NCCN guidelines, she is to have a CT scan every year x 5 years, but she has not had one since April 2013 and has refused repeat since diagnosis.  I  provided her education regarding NCCN guidelines pertaining to surveillance.   She is concerned about her "sugar."  Her glucose today is 93 and WNL.  She relays a story to me regarding her blood sugar and work-up from her primary care provider.  The story is very convoluted and difficult to follow.  She follows nephrology with Dr. Theador Hawthorne.  She reports her "sugar problem" is low sugar.  Oncologically, she denies any complaints and ROS questioning is negative.   Past Medical History  Diagnosis Date  . Hypertension   . GERD (gastroesophageal reflux disease)   . Proteinuria   . Headache(784.0)   . Arthritis     knees,  shoulders  . Leg swelling   . Rectal bleeding   . Weakness   . Blood in stool   . Swelling     in legs   . Chronic kidney disease     chronic kidney disease   . Adenocarcinoma 09/13/2011    At the hepatic flexure    has HYPERTENSION; CARDIAC MURMUR; HEARTBURN; GI bleed; Anemia; Obesity; and Right hemicolectomy June 2013 for node negative adenocarcinoma on her problem list.     is allergic to chocolate; honey; onion; penicillins; sulfonamide derivatives; aspirin; and codeine.  Ms. Gebhart does not currently have medications on file.  Past Surgical History  Procedure Laterality Date  . Total abdominal hysterectomy w/ bilateral salpingoophorectomy    . Esophagogastroduodenoscopy  06/20/2011    Procedure: ESOPHAGOGASTRODUODENOSCOPY (EGD);  Surgeon: Rogene Houston, MD;  [facility];  Service: Endoscopy;  Laterality: N/A;  . Colonoscopy  06/21/2011    Procedure: COLONOSCOPY;  Surgeon: Rogene Houston, MD;  [facility]: [facility];  Service: Endoscopy;  Laterality: N/A;  . Abdominal hysterectomy  1963  . Hemicolectomy  09/13/2011  . Cataract extraction w/phaco Right 08/27/2012    Procedure: CATARACT EXTRACTION PHACO AND INTRAOCULAR LENS PLACEMENT (IOC);  Surgeon: Elta Guadeloupe T. Gershon Crane, MD;  Location: [facility];  Service: Ophthalmology;  Laterality: Right;  CDE:9.80  .  Colonoscopy N/A 09/12/2013    Procedure: COLONOSCOPY;  Surgeon: Rogene Houston, MD;  [facility]: [facility];  Service: Endoscopy;  Laterality: N/A;  1240    Denies any headaches, dizziness, double vision, fevers, chills, night sweats, nausea, vomiting, diarrhea, constipation, chest pain, heart palpitations, shortness of breath, blood in stool, black tarry stool, urinary pain, urinary burning, urinary frequency, hematuria.   PHYSICAL EXAMINATION  ECOG PERFORMANCE STATUS: 1 - Symptomatic but completely ambulatory  Filed Vitals:   03/25/14 1100  BP: 190/61  Pulse: 80  Temp: 97.6 F (36.4 C)  Resp: 18    GENERAL:alert, no distress, well nourished, well developed, comfortable, cooperative and smiling SKIN: skin color, texture, turgor are normal, no rashes or significant lesions HEAD: Normocephalic, No masses, lesions, tenderness or abnormalities EYES: normal, PERRLA, EOMI, Conjunctiva are pink and non-injected EARS: External ears normal OROPHARYNX:lips, buccal mucosa, and tongue normal and mucous membranes are moist  NECK: supple, no adenopathy, thyroid normal size, non-tender, without nodularity, no stridor, non-tender, trachea midline LYMPH:  no palpable lymphadenopathy, no hepatosplenomegaly BREAST:not examined LUNGS: clear to auscultation and percussion HEART: regular rate & rhythm, no murmurs, no gallops, S1 normal and S2 normal ABDOMEN:abdomen soft, non-tender, normal bowel sounds, no masses or organomegaly and no hepatosplenomegaly BACK: Back symmetric, no curvature., No CVA tenderness EXTREMITIES:less then 2 second capillary refill, no joint deformities, effusion, or inflammation, no skin discoloration, no clubbing, no cyanosis, positive findings:  edema B/L LE edema, 2+ pitting.  NEURO: alert & oriented x 3 with fluent speech, no focal motor/sensory deficits, gait normal   LABORATORY DATA: CBC    Component Value Date/Time   WBC 6.6 03/25/2014 1101   RBC 3.54* 03/25/2014  1101   HGB 10.7* 03/25/2014 1101   HCT 31.5* 03/25/2014 1101   PLT 266 03/25/2014 1101   MCV 89.0 03/25/2014 1101   MCH 30.2 03/25/2014 1101   MCHC 34.0 03/25/2014 1101   RDW 17.4* 03/25/2014 1101   LYMPHSABS 1.7 03/25/2014 1101   MONOABS 0.3 03/25/2014 1101   EOSABS 0.1 03/25/2014 1101   BASOSABS 0.0 03/25/2014 1101      Chemistry      Component Value Date/Time   NA 142 03/25/2014 1101   K 3.8 03/25/2014 1101   CL 111 03/25/2014 1101   CO2 27 03/25/2014 1101   BUN 37* 03/25/2014 1101   CREATININE 1.30* 03/25/2014 1101      Component Value Date/Time   CALCIUM 9.9 03/25/2014 1101   ALKPHOS 74 03/25/2014 1101   AST 19 03/25/2014 1101   ALT 16 03/25/2014 1101   BILITOT 0.6 03/25/2014 1101     Lab Results  Component Value Date   CEA 2.2 09/22/2013      ASSESSMENT AND PLAN:  Right hemicolectomy June 2013 for node negative adenocarcinoma She is noncompliant with recommended surveillance per NCCN guidelines.  She is overdue for CT abd/pelvis and she continues to refuse this imaging test.  She is due for labs which are ordered to be completed today: CBC diff, CMET, CEA.  Labs in 6 months: CBC diff, CMET, CEA.  NCCN guidelines pertaining to  surveillance are reviewed and CT imaging is strongly recommended annually x 5 years.  However, she declines based upon her "readings and prayers."  Return in 6 months for follow-up with labs: CBC diff, CMET, CEA.  Next colonoscopy is due in June 2018 by Dr. Laural Golden.  Anemia Anemia panel ordered today to rule out vitamin deficiencies.   THERAPY PLAN:  NCCN guidelines for surveillance for T3/T4, N0-2, M0 colorectal cancer are:   A. H+P every 3-6 months x 2 years and then every 6 months for a total of 5 years   B. CEA every 3 months x 2 years and then every 6 months for a total of 5 years   C. CT abd/pelvis annually for up to 5 years   D. Colonoscopy in 1 year except if no preoperative colonoscopy due to obstructing lesion, colonoscopy in 3-6  months.    1. If advanced adenoma, repeat in 1 year   2. If no advanced adenoma, repeat in 3 years, then every 5 years  E. PET scan not routinely recommended.   All questions were answered. The patient knows to call the clinic with any problems, questions or concerns. We can certainly see the patient much sooner if necessary.  Patient and plan discussed with Dr. Ancil Linsey and she is in agreement with the aforementioned.   KEFALAS,THOMAS 03/25/2014

## 2014-03-22 NOTE — Assessment & Plan Note (Signed)
Anemia panel ordered today to rule out vitamin deficiencies.

## 2014-03-22 NOTE — Assessment & Plan Note (Addendum)
She is noncompliant with recommended surveillance per NCCN guidelines.  She is overdue for CT abd/pelvis and she continues to refuse this imaging test.  She is due for labs which are ordered to be completed today: CBC diff, CMET, CEA.  Labs in 6 months: CBC diff, CMET, CEA.  NCCN guidelines pertaining to surveillance are reviewed and CT imaging is strongly recommended annually x 5 years.  However, she declines based upon her "readings and prayers."  Return in 6 months for follow-up with labs: CBC diff, CMET, CEA.  Next colonoscopy is due in June 2018 by Dr. Laural Golden.

## 2014-03-25 ENCOUNTER — Encounter (HOSPITAL_COMMUNITY): Payer: Medicare Other | Attending: Hematology & Oncology | Admitting: Oncology

## 2014-03-25 ENCOUNTER — Encounter (HOSPITAL_BASED_OUTPATIENT_CLINIC_OR_DEPARTMENT_OTHER): Payer: Medicare Other

## 2014-03-25 ENCOUNTER — Encounter (HOSPITAL_COMMUNITY): Payer: Self-pay | Admitting: Oncology

## 2014-03-25 VITALS — BP 190/61 | HR 80 | Temp 97.6°F | Resp 18 | Wt 145.7 lb

## 2014-03-25 DIAGNOSIS — Z85038 Personal history of other malignant neoplasm of large intestine: Secondary | ICD-10-CM | POA: Insufficient documentation

## 2014-03-25 DIAGNOSIS — Z9049 Acquired absence of other specified parts of digestive tract: Secondary | ICD-10-CM | POA: Diagnosis not present

## 2014-03-25 DIAGNOSIS — Z9119 Patient's noncompliance with other medical treatment and regimen: Secondary | ICD-10-CM | POA: Diagnosis not present

## 2014-03-25 DIAGNOSIS — I129 Hypertensive chronic kidney disease with stage 1 through stage 4 chronic kidney disease, or unspecified chronic kidney disease: Secondary | ICD-10-CM | POA: Diagnosis not present

## 2014-03-25 DIAGNOSIS — C183 Malignant neoplasm of hepatic flexure: Secondary | ICD-10-CM

## 2014-03-25 DIAGNOSIS — Z08 Encounter for follow-up examination after completed treatment for malignant neoplasm: Secondary | ICD-10-CM | POA: Insufficient documentation

## 2014-03-25 DIAGNOSIS — Z90722 Acquired absence of ovaries, bilateral: Secondary | ICD-10-CM | POA: Insufficient documentation

## 2014-03-25 DIAGNOSIS — C189 Malignant neoplasm of colon, unspecified: Secondary | ICD-10-CM

## 2014-03-25 DIAGNOSIS — Z90710 Acquired absence of both cervix and uterus: Secondary | ICD-10-CM | POA: Diagnosis not present

## 2014-03-25 DIAGNOSIS — D649 Anemia, unspecified: Secondary | ICD-10-CM | POA: Diagnosis not present

## 2014-03-25 LAB — CBC WITH DIFFERENTIAL/PLATELET
Basophils Absolute: 0 10*3/uL (ref 0.0–0.1)
Basophils Relative: 0 % (ref 0–1)
EOS PCT: 1 % (ref 0–5)
Eosinophils Absolute: 0.1 10*3/uL (ref 0.0–0.7)
HCT: 31.5 % — ABNORMAL LOW (ref 36.0–46.0)
HEMOGLOBIN: 10.7 g/dL — AB (ref 12.0–15.0)
LYMPHS ABS: 1.7 10*3/uL (ref 0.7–4.0)
Lymphocytes Relative: 26 % (ref 12–46)
MCH: 30.2 pg (ref 26.0–34.0)
MCHC: 34 g/dL (ref 30.0–36.0)
MCV: 89 fL (ref 78.0–100.0)
MONO ABS: 0.3 10*3/uL (ref 0.1–1.0)
MONOS PCT: 5 % (ref 3–12)
Neutro Abs: 4.5 10*3/uL (ref 1.7–7.7)
Neutrophils Relative %: 68 % (ref 43–77)
PLATELETS: 266 10*3/uL (ref 150–400)
RBC: 3.54 MIL/uL — ABNORMAL LOW (ref 3.87–5.11)
RDW: 17.4 % — ABNORMAL HIGH (ref 11.5–15.5)
WBC: 6.6 10*3/uL (ref 4.0–10.5)

## 2014-03-25 LAB — COMPREHENSIVE METABOLIC PANEL
ALK PHOS: 74 U/L (ref 39–117)
ALT: 16 U/L (ref 0–35)
AST: 19 U/L (ref 0–37)
Albumin: 4.1 g/dL (ref 3.5–5.2)
Anion gap: 4 — ABNORMAL LOW (ref 5–15)
BUN: 37 mg/dL — ABNORMAL HIGH (ref 6–23)
CALCIUM: 9.9 mg/dL (ref 8.4–10.5)
CO2: 27 mmol/L (ref 19–32)
Chloride: 111 mEq/L (ref 96–112)
Creatinine, Ser: 1.3 mg/dL — ABNORMAL HIGH (ref 0.50–1.10)
GFR calc Af Amer: 44 mL/min — ABNORMAL LOW (ref >90)
GFR calc non Af Amer: 38 mL/min — ABNORMAL LOW (ref >90)
Glucose, Bld: 93 mg/dL (ref 70–99)
Potassium: 3.8 mmol/L (ref 3.5–5.1)
SODIUM: 142 mmol/L (ref 135–145)
Total Bilirubin: 0.6 mg/dL (ref 0.3–1.2)
Total Protein: 6.9 g/dL (ref 6.0–8.3)

## 2014-03-25 LAB — IRON AND TIBC
Iron: 85 ug/dL (ref 42–145)
Saturation Ratios: 33 % (ref 20–55)
TIBC: 259 ug/dL (ref 250–470)
UIBC: 174 ug/dL (ref 125–400)

## 2014-03-25 LAB — FOLATE: Folate: >20 ng/mL

## 2014-03-25 LAB — FERRITIN: Ferritin: 222 ng/mL (ref 10–291)

## 2014-03-25 LAB — VITAMIN B12: Vitamin B-12: 556 pg/mL (ref 211–911)

## 2014-03-25 NOTE — Patient Instructions (Signed)
[facility] Discharge Instructions  RECOMMENDATIONS MADE BY THE CONSULTANT AND ANY TEST RESULTS WILL BE SENT TO YOUR REFERRING PHYSICIAN.  It is recommended that you have yearly CT scans of your belly.  Your last one was in 2013.  You are overdue for this.  This recommendation is per NCCN guidelines.   Labs were performed today and we will call you with the results of the labs that are presently pending.  Continue follow-up with your primary care provider and kidney doctor as directed.  Labs in 6 months Return in 6 months for follow-up. I recommend biotene mouthwash for dry mouth.  Happy New Years  CBC    Component Value Date/Time   WBC 6.6 03/25/2014 1101   RBC 3.54* 03/25/2014 1101   HGB 10.7* 03/25/2014 1101   HCT 31.5* 03/25/2014 1101   PLT 266 03/25/2014 1101   MCV 89.0 03/25/2014 1101   MCH 30.2 03/25/2014 1101   MCHC 34.0 03/25/2014 1101   RDW 17.4* 03/25/2014 1101   LYMPHSABS 1.7 03/25/2014 1101   MONOABS 0.3 03/25/2014 1101   EOSABS 0.1 03/25/2014 1101   BASOSABS 0.0 03/25/2014 1101      Chemistry      Component Value Date/Time   NA 142 03/25/2014 1101   K 3.8 03/25/2014 1101   CL 111 03/25/2014 1101   CO2 27 03/25/2014 1101   BUN 37* 03/25/2014 1101   CREATININE 1.30* 03/25/2014 1101      Component Value Date/Time   CALCIUM 9.9 03/25/2014 1101   ALKPHOS 74 03/25/2014 1101   AST 19 03/25/2014 1101   ALT 16 03/25/2014 1101   BILITOT 0.6 03/25/2014 1101       NCCN guidelines for surveillance for T3/T4, N0-2, M0 colorectal cancer are:   A. H+P every 3-6 months x 2 years and then every 6 months for a total of 5 years   B. CEA every 3 months x 2 years and then every 6 months for a total of 5 years   C. CT abd/pelvis annually for up to 5 years   D. Colonoscopy in 1 year except if no preoperative colonoscopy due to obstructing lesion, colonoscopy in 3-6 months.    1. If advanced adenoma, repeat in 1 year   2. If no advanced adenoma,  repeat in 3 years, then every 5 years  E. PET scan not routinely recommended.  Thank you for choosing [facility] to provide your oncology and hematology care.  To afford each patient quality time with our providers, please arrive at least 15 minutes before your scheduled appointment time.  With your help, our goal is to use those 15 minutes to complete the necessary work-up to ensure our physicians have the information they need to help with your evaluation and healthcare recommendations.    Effective January 1st, 2014, we ask that you re-schedule your appointment with our physicians should you arrive 10 or more minutes late for your appointment.  We strive to give you quality time with our providers, and arriving late affects you and other patients whose appointments are after yours.    Again, thank you for choosing [facility].  Our hope is that these requests will decrease the amount of time that you wait before being seen by our physicians.       _____________________________________________________________  Should you have questions after your visit to [facility], please contact our office at [phone] between the hours of 8:30  a.m. and 5:00 p.m.  Voicemails left after 4:30 p.m. will not be returned until the following business day.  For prescription refill requests, have your pharmacy contact our office with your prescription refill request.

## 2014-03-25 NOTE — Progress Notes (Signed)
LABS FOR FERR,FOL,IRON/TIBC,B12,CEA,CMP,CBCD

## 2014-03-26 ENCOUNTER — Other Ambulatory Visit (HOSPITAL_COMMUNITY): Payer: Self-pay | Admitting: Oncology

## 2014-03-26 ENCOUNTER — Telehealth (HOSPITAL_COMMUNITY): Payer: Self-pay | Admitting: Oncology

## 2014-03-26 DIAGNOSIS — R970 Elevated carcinoembryonic antigen [CEA]: Secondary | ICD-10-CM

## 2014-03-26 DIAGNOSIS — C189 Malignant neoplasm of colon, unspecified: Secondary | ICD-10-CM

## 2014-03-26 LAB — CEA: CEA: 6.3 ng/mL — ABNORMAL HIGH (ref 0.0–4.7)

## 2014-03-26 NOTE — Telephone Encounter (Signed)
Patient has a few questions about upcoming CT scans.  They were answered to her satisfaction.  Vicki Woods 03/26/2014

## 2014-03-31 ENCOUNTER — Ambulatory Visit (HOSPITAL_COMMUNITY)
Admission: RE | Admit: 2014-03-31 | Discharge: 2014-03-31 | Disposition: A | Discharge status: Home / Self Care | Payer: Medicare Other | Source: Ambulatory Visit | Attending: Oncology | Admitting: Oncology

## 2014-03-31 DIAGNOSIS — R970 Elevated carcinoembryonic antigen [CEA]: Secondary | ICD-10-CM | POA: Insufficient documentation

## 2014-03-31 DIAGNOSIS — R109 Unspecified abdominal pain: Secondary | ICD-10-CM | POA: Insufficient documentation

## 2014-03-31 DIAGNOSIS — C189 Malignant neoplasm of colon, unspecified: Secondary | ICD-10-CM | POA: Diagnosis present

## 2014-03-31 MED ORDER — IOHEXOL 300 MG/ML  SOLN
100.0000 mL | Freq: Once | INTRAMUSCULAR | Status: AC | PRN
  Administered 2014-03-31: 80 mL via INTRAVENOUS

## 2014-04-07 ENCOUNTER — Telehealth (HOSPITAL_COMMUNITY): Payer: Self-pay | Admitting: *Deleted

## 2014-04-07 NOTE — Telephone Encounter (Signed)
Spoke with patient. Pleased with results. Verbalized understanding.

## 2014-04-07 NOTE — Telephone Encounter (Signed)
-----   Message from Baird Cancer, PA-C sent at 04/06/2014  5:17 PM EST ----- Regarding: FW: CT results Please call her and let he know that her CT of abdomen was negative.  I will see her back as scheduled and review all the details.   ----- Message -----    From: Louis Meckel    Sent: 04/06/2014   8:26 AM      To: Baird Cancer, PA-C Subject: CT results                                     Patient would like you to call her with her CT results at [phone].

## 2014-04-15 NOTE — Assessment & Plan Note (Signed)
Stage II A (T3N0M0) colon cancer, S/P right hemicolectomy 09/13/2011 by Dr. Hassell Done. 16 nodes were negative no LV I was seen but she did have perineural space invasion found and a 4.5 cm tumor. This was a grade 1 tumor, but ulcerated. 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 There is no family history of colon cancer that she is aware of. She has no children. Recent CEA elevation leading to CT imaging of abd/pelvis on 03/31/2014, as she has been resistant to surveillance imaging in past despite NCCN guidelines recommendations.  CT imaging on 03/31/2014 was negative for recurrence.  CT chest was ordered, but not performed.  Will closely monitor moving forward.  Labs today: CBC diff, CMET, CEA.  Labs in 1 months: CBC diff, CMET, CEA and follow-up appointment in 1 month.

## 2014-04-15 NOTE — Progress Notes (Signed)
-  No show-  KEFALAS,THOMAS  

## 2014-04-16 ENCOUNTER — Ambulatory Visit (HOSPITAL_COMMUNITY): Payer: Medicare Other | Admitting: Oncology

## 2014-09-08 ENCOUNTER — Other Ambulatory Visit (HOSPITAL_COMMUNITY): Payer: Self-pay | Admitting: Internal Medicine

## 2014-09-08 DIAGNOSIS — R791 Abnormal coagulation profile: Secondary | ICD-10-CM

## 2014-09-10 ENCOUNTER — Ambulatory Visit (HOSPITAL_COMMUNITY)
Admission: RE | Admit: 2014-09-10 | Discharge: 2014-09-10 | Disposition: A | Discharge status: Home / Self Care | Payer: Medicare Other | Source: Ambulatory Visit | Attending: Internal Medicine | Admitting: Internal Medicine

## 2014-09-10 DIAGNOSIS — M79605 Pain in left leg: Secondary | ICD-10-CM | POA: Insufficient documentation

## 2014-09-10 DIAGNOSIS — Z85038 Personal history of other malignant neoplasm of large intestine: Secondary | ICD-10-CM | POA: Diagnosis not present

## 2014-09-10 DIAGNOSIS — G8929 Other chronic pain: Secondary | ICD-10-CM | POA: Diagnosis not present

## 2014-09-10 DIAGNOSIS — R791 Abnormal coagulation profile: Secondary | ICD-10-CM | POA: Insufficient documentation

## 2014-09-10 DIAGNOSIS — M79604 Pain in right leg: Secondary | ICD-10-CM | POA: Insufficient documentation

## 2014-09-23 ENCOUNTER — Other Ambulatory Visit (HOSPITAL_COMMUNITY): Payer: Medicare Other

## 2014-09-23 ENCOUNTER — Ambulatory Visit (HOSPITAL_COMMUNITY): Payer: Medicare Other | Admitting: Oncology

## 2014-09-23 ENCOUNTER — Encounter (HOSPITAL_COMMUNITY): Payer: Medicare Other | Attending: Oncology | Admitting: Oncology

## 2014-09-23 ENCOUNTER — Encounter (HOSPITAL_COMMUNITY): Payer: Self-pay | Admitting: Oncology

## 2014-09-23 VITALS — BP 184/61 | HR 66 | Temp 98.6°F | Resp 20 | Wt 151.1 lb

## 2014-09-23 DIAGNOSIS — K219 Gastro-esophageal reflux disease without esophagitis: Secondary | ICD-10-CM | POA: Diagnosis not present

## 2014-09-23 DIAGNOSIS — N189 Chronic kidney disease, unspecified: Secondary | ICD-10-CM | POA: Diagnosis not present

## 2014-09-23 DIAGNOSIS — C182 Malignant neoplasm of ascending colon: Secondary | ICD-10-CM

## 2014-09-23 DIAGNOSIS — Z9049 Acquired absence of other specified parts of digestive tract: Secondary | ICD-10-CM | POA: Insufficient documentation

## 2014-09-23 DIAGNOSIS — D649 Anemia, unspecified: Secondary | ICD-10-CM | POA: Diagnosis not present

## 2014-09-23 DIAGNOSIS — I129 Hypertensive chronic kidney disease with stage 1 through stage 4 chronic kidney disease, or unspecified chronic kidney disease: Secondary | ICD-10-CM | POA: Diagnosis not present

## 2014-09-23 DIAGNOSIS — C189 Malignant neoplasm of colon, unspecified: Secondary | ICD-10-CM | POA: Diagnosis present

## 2014-09-23 LAB — CBC WITH DIFFERENTIAL/PLATELET
BASOS PCT: 0 % (ref 0–1)
Basophils Absolute: 0 10*3/uL (ref 0.0–0.1)
EOS ABS: 0.1 10*3/uL (ref 0.0–0.7)
Eosinophils Relative: 1 % (ref 0–5)
HEMATOCRIT: 32.5 % — AB (ref 36.0–46.0)
Hemoglobin: 10.7 g/dL — ABNORMAL LOW (ref 12.0–15.0)
LYMPHS PCT: 27 % (ref 12–46)
Lymphs Abs: 1.4 10*3/uL (ref 0.7–4.0)
MCH: 29.5 pg (ref 26.0–34.0)
MCHC: 32.9 g/dL (ref 30.0–36.0)
MCV: 89.5 fL (ref 78.0–100.0)
MONO ABS: 0.2 10*3/uL (ref 0.1–1.0)
Monocytes Relative: 4 % (ref 3–12)
NEUTROS PCT: 68 % (ref 43–77)
Neutro Abs: 3.4 10*3/uL (ref 1.7–7.7)
PLATELETS: 242 10*3/uL (ref 150–400)
RBC: 3.63 MIL/uL — ABNORMAL LOW (ref 3.87–5.11)
RDW: 16.8 % — AB (ref 11.5–15.5)
WBC: 5 10*3/uL (ref 4.0–10.5)

## 2014-09-23 LAB — COMPREHENSIVE METABOLIC PANEL
ALBUMIN: 3.9 g/dL (ref 3.5–5.0)
ALT: 12 U/L — ABNORMAL LOW (ref 14–54)
AST: 22 U/L (ref 15–41)
Alkaline Phosphatase: 64 U/L (ref 38–126)
Anion gap: 6 (ref 5–15)
BUN: 29 mg/dL — ABNORMAL HIGH (ref 6–20)
CALCIUM: 9.6 mg/dL (ref 8.9–10.3)
CO2: 27 mmol/L (ref 22–32)
CREATININE: 0.94 mg/dL (ref 0.44–1.00)
Chloride: 109 mmol/L (ref 101–111)
GFR calc Af Amer: >60 mL/min (ref >60)
GFR calc non Af Amer: 55 mL/min — ABNORMAL LOW (ref >60)
Glucose, Bld: 90 mg/dL (ref 65–99)
Potassium: 3.9 mmol/L (ref 3.5–5.1)
Sodium: 142 mmol/L (ref 135–145)
TOTAL PROTEIN: 7 g/dL (ref 6.5–8.1)
Total Bilirubin: 0.7 mg/dL (ref 0.3–1.2)

## 2014-09-23 NOTE — Progress Notes (Signed)
Vicki [place], [place] [address]  Right hemicolectomy June 2013 for node negative adenocarcinoma - Plan: CBC with Differential, Comprehensive metabolic panel, CEA, CBC with Differential, CEA, Comprehensive metabolic panel, CBC with Differential, CANCELED: Comprehensive metabolic panel, CANCELED: CEA  Anemia, unspecified anemia type - Plan: CBC with Differential  CURRENT THERAPY: Surveillance per NCCN guidelines  INTERVAL HISTORY: Vicki Woods 81 y.o. female returns for followup of Stage II A (T3N0M0) colon cancer, S/P right hemicolectomy 09/13/2011 by Dr. Hassell Done. 16 nodes were negative no LV I was seen but she did have perineural space invasion found and a 4.5 cm tumor. This was a grade 1 tumor, but ulcerated. 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 There is no family history of colon cancer that she is aware of. She has no children.  She has been resistant to surveillance per NCCN guidelines.    Right hemicolectomy June 2013 for node negative adenocarcinoma   06/21/2011 Initial Diagnosis Colonoscopy by Dr. Laural Golden- hepatic flexure polypectomy reveals adenocarcinoma arising from tubular adenoma.   06/22/2011 Imaging CT- Minimal amount of fluid in Morison's pouch and small amount of fluid within the pelvis.  Etiology indeterminate.  Too small to characterize liver lesions possibly 2 small cysts although metastatic lesions not ruled out.     09/13/2011 Definitive Surgery R ascending colon resection by Dr. Johnathan Hausen demonstraing an ulcerative, invasive, well-differentiated adenocarcinoma measuring 4.5 cm.  No LVI, but perineural invasion noted.  0/16 lymph nodes.  Clear margins.   09/12/2013 Survivorship Colonoscopy by Dr. Laural Golden- tubular adenoma, not dysplasia or malignancy.   03/25/2014 Tumor Marker CEA- 6.3   03/31/2014 Imaging CT abd/pelvis- No evidence of recurrent or metastatic carcinoma within the abdomen or  pelvis. No other acute findings identified. Stable benign bilateral adrenal gland hyperplasia versus adenomas.    I personally reviewed and went over laboratory results with the patient.  The results are noted within this dictation.  We will update labs today.  Her CEA in Jan 2016 demonstrated signs of increase and therefore CT scans were ordered and scheduled.  CT abd/pelvis was completed and negative for recurrence.  CT of chest was ordered, but not completed, unknown to me.  I personally reviewed and went over radiographic studies with the patient.  The results are noted within this dictation.       Past Medical History  Diagnosis Date  . Hypertension   . GERD (gastroesophageal reflux disease)   . Proteinuria   . Headache(784.0)   . Arthritis     knees,  shoulders  . Leg swelling   . Rectal bleeding   . Weakness   . Blood in stool   . Swelling     in legs   . Chronic kidney disease     chronic kidney disease   . Adenocarcinoma 09/13/2011    At the hepatic flexure    has HYPERTENSION; CARDIAC MURMUR; HEARTBURN; GI bleed; Anemia; Obesity; and Right hemicolectomy June 2013 for node negative adenocarcinoma on her problem list.     is allergic to chocolate; honey; onion; penicillins; sulfonamide derivatives; aspirin; and codeine.  Ms. Guerrieri does not currently have medications on file.  Past Surgical History  Procedure Laterality Date  . Total abdominal hysterectomy w/ bilateral salpingoophorectomy    . Esophagogastroduodenoscopy  06/20/2011    Procedure: ESOPHAGOGASTRODUODENOSCOPY (EGD);  Surgeon: Rogene Houston, MD;  [facility];  Service: Endoscopy;  Laterality: N/A;  .  Colonoscopy  06/21/2011    Procedure: COLONOSCOPY;  Surgeon: Rogene Houston, MD;  [facility];  Service: Endoscopy;  Laterality: N/A;  . Abdominal hysterectomy  1963  . Hemicolectomy  09/13/2011  . Cataract extraction w/phaco Right 08/27/2012    Procedure: CATARACT EXTRACTION PHACO  AND INTRAOCULAR LENS PLACEMENT (IOC);  Surgeon: Elta Guadeloupe T. Gershon Crane, MD;  Location: [facility];  Service: Ophthalmology;  Laterality: Right;  CDE:9.80  . Colonoscopy N/A 09/12/2013    Procedure: COLONOSCOPY;  Surgeon: Rogene Houston, MD;  [facility];  Service: Endoscopy;  Laterality: N/A;  1240    Denies any headaches, dizziness, double vision, fevers, chills, night sweats, nausea, vomiting, diarrhea, constipation, chest pain, heart palpitations, shortness of breath, blood in stool, black tarry stool, urinary pain, urinary burning, urinary frequency, hematuria.   PHYSICAL EXAMINATION  ECOG PERFORMANCE STATUS: 1 - Symptomatic but completely ambulatory  Filed Vitals:   09/23/14 1442  BP: 184/61  Pulse: 66  Temp: 98.6 F (37 C)  Resp: 20    GENERAL:alert, no distress, well nourished, well developed, comfortable, cooperative and smiling SKIN: skin color, texture, turgor are normal, no rashes or significant lesions HEAD: Normocephalic, No masses, lesions, tenderness or abnormalities EYES: normal, PERRLA, EOMI, Conjunctiva are pink and non-injected EARS: External ears normal OROPHARYNX:lips, buccal mucosa, and tongue normal and mucous membranes are moist  NECK: supple, no adenopathy, thyroid normal size, non-tender, without nodularity, no stridor, non-tender, trachea midline LYMPH:  no palpable lymphadenopathy, no hepatosplenomegaly BREAST:not examined LUNGS: clear to auscultation and percussion HEART: regular rate & rhythm, no murmurs, no gallops, S1 normal and S2 normal ABDOMEN:abdomen soft, non-tender, normal bowel sounds, no masses or organomegaly and no hepatosplenomegaly BACK: Back symmetric, no curvature., No CVA tenderness EXTREMITIES:less then 2 second capillary refill, no joint deformities, effusion, or inflammation, no skin discoloration, no clubbing, no cyanosis, positive findings:  edema B/L LE edema, 2+ pitting.  NEURO: alert & oriented x 3 with fluent speech, no focal  motor/sensory deficits, gait normal   LABORATORY DATA: CBC    Component Value Date/Time   WBC 6.6 03/25/2014 1101   RBC 3.54* 03/25/2014 1101   HGB 10.7* 03/25/2014 1101   HCT 31.5* 03/25/2014 1101   PLT 266 03/25/2014 1101   MCV 89.0 03/25/2014 1101   MCH 30.2 03/25/2014 1101   MCHC 34.0 03/25/2014 1101   RDW 17.4* 03/25/2014 1101   LYMPHSABS 1.7 03/25/2014 1101   MONOABS 0.3 03/25/2014 1101   EOSABS 0.1 03/25/2014 1101   BASOSABS 0.0 03/25/2014 1101      Chemistry      Component Value Date/Time   NA 142 03/25/2014 1101   K 3.8 03/25/2014 1101   CL 111 03/25/2014 1101   CO2 27 03/25/2014 1101   BUN 37* 03/25/2014 1101   CREATININE 1.30* 03/25/2014 1101      Component Value Date/Time   CALCIUM 9.9 03/25/2014 1101   ALKPHOS 74 03/25/2014 1101   AST 19 03/25/2014 1101   ALT 16 03/25/2014 1101   BILITOT 0.6 03/25/2014 1101     Lab Results  Component Value Date   CEA 6.3* 03/25/2014      ASSESSMENT AND PLAN:  Right hemicolectomy June 2013 for node negative adenocarcinoma Stage II A (T3N0M0) colon cancer, S/P right hemicolectomy 09/13/2011 by Dr. Hassell Done. 16 nodes were negative no LV I was seen but she did have perineural space invasion found and a 4.5 cm tumor. This was a grade 1 tumor, but ulcerated. She had other  tubulovillous adenomas found that had no malignant elements. There is no family history of colon cancer that she is aware of. She has no children.  She has been resistant to surveillance per NCCN guidelines.  Last CEA was elevated leading to CT abd/pelvis which was negative in Jan 2016.  CT chest was ordered by not performed, unknown reason.  Last colonoscopy was in June 2015, at which time it was negative.  Labs today: CBC diff, CMET, CEA  Labs in 6 months: CBC diff, CMET, CEA  Return in 6 months for follow-up.  If CEA remains elevated, will need to consider CT of chest +/- abd/pelvis.  Anemia Etiology unclear.  Anemia panel is WNL.  If CEA  normalizes, anemia work-up will need to be further pursued with MM panel and possibly bone marrow aspiration and biopsy.  Patient has been historically resistant to interventions.    THERAPY PLAN:  NCCN guidelines for surveillance for T3/T4, N0-2, M0 colorectal cancer are:   A. H+P every 3-6 months x 2 years and then every 6 months for a total of 5 years   B. CEA every 3 months x 2 years and then every 6 months for a total of 5 years   C. CT abd/pelvis annually for up to 5 years   D. Colonoscopy in 1 year except if no preoperative colonoscopy due to obstructing lesion, colonoscopy in 3-6 months.    1. If advanced adenoma, repeat in 1 year   2. If no advanced adenoma, repeat in 3 years, then every 5 years  E. PET scan not routinely recommended.   All questions were answered. The patient knows to call the clinic with any problems, questions or concerns. We can certainly see the patient much sooner if necessary.  Patient and plan discussed with Dr. Ancil Linsey and she is in agreement with the aforementioned.   Donnah Levert 09/23/2014

## 2014-09-23 NOTE — Patient Instructions (Signed)
..  [place] at [facility] Discharge Instructions  RECOMMENDATIONS MADE BY THE CONSULTANT AND ANY TEST RESULTS WILL BE SENT TO YOUR REFERRING PHYSICIAN.  Seen and discussion with Robynn Pane PA-C. Lab work done today.  We will call you with the results. Call the cancer center with any question and/or concerns that you have. Follow up appt in 6 months.     Thank you for choosing [place] at [facility] to provide your oncology and hematology care.  To afford each patient quality time with our provider, please arrive at least 15 minutes before your scheduled appointment time.    You need to re-schedule your appointment should you arrive 10 or more minutes late.  We strive to give you quality time with our providers, and arriving late affects you and other patients whose appointments are after yours.  Also, if you no show three or more times for appointments you may be dismissed from the clinic at the providers discretion.     Again, thank you for choosing [facility].  Our hope is that these requests will decrease the amount of time that you wait before being seen by our physicians.       _____________________________________________________________  Should you have questions after your visit to [facility], please contact our office at [phone] between the hours of 8:30 a.m. and 4:30 p.m.  Voicemails left after 4:30 p.m. will not be returned until the following business day.  For prescription refill requests, have your pharmacy contact our office.

## 2014-09-23 NOTE — Assessment & Plan Note (Signed)
Stage II A (T3N0M0) colon cancer, S/P right hemicolectomy 09/13/2011 by Dr. Hassell Done. 16 nodes were negative no LV I was seen but she did have perineural space invasion found and a 4.5 cm tumor. This was a grade 1 tumor, but ulcerated. 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 There is no family history of colon cancer that she is aware of. She has no children.  She has been resistant to surveillance per NCCN guidelines.  Last CEA was elevated leading to CT abd/pelvis which was negative in Jan 2016.  CT chest was ordered by not performed, unknown reason.  Last colonoscopy was in June 2015, at which time it was negative.  Labs today: CBC diff, CMET, CEA  Labs in 6 months: CBC diff, CMET, CEA  Return in 6 months for follow-up.  If CEA remains elevated, will need to consider CT of chest +/- abd/pelvis.

## 2014-09-23 NOTE — Assessment & Plan Note (Signed)
Etiology unclear.  Anemia panel is WNL.  If CEA normalizes, anemia work-up will need to be further pursued with MM panel and possibly bone marrow aspiration and biopsy.  Patient has been historically resistant to interventions.

## 2014-09-24 LAB — CEA: CEA: 6 ng/mL — ABNORMAL HIGH (ref 0.0–4.7)

## 2014-10-31 ENCOUNTER — Encounter (HOSPITAL_COMMUNITY): Payer: Self-pay | Admitting: *Deleted

## 2014-10-31 ENCOUNTER — Emergency Department (HOSPITAL_COMMUNITY): Payer: Medicare Other

## 2014-10-31 ENCOUNTER — Emergency Department (HOSPITAL_COMMUNITY)
Admission: EM | Admit: 2014-10-31 | Discharge: 2014-10-31 | Disposition: A | Discharge status: Home / Self Care | Payer: Medicare Other | Attending: Emergency Medicine | Admitting: Emergency Medicine

## 2014-10-31 DIAGNOSIS — Z8639 Personal history of other endocrine, nutritional and metabolic disease: Secondary | ICD-10-CM | POA: Insufficient documentation

## 2014-10-31 DIAGNOSIS — N289 Disorder of kidney and ureter, unspecified: Secondary | ICD-10-CM

## 2014-10-31 DIAGNOSIS — Z859 Personal history of malignant neoplasm, unspecified: Secondary | ICD-10-CM | POA: Insufficient documentation

## 2014-10-31 DIAGNOSIS — R609 Edema, unspecified: Secondary | ICD-10-CM | POA: Diagnosis not present

## 2014-10-31 DIAGNOSIS — N189 Chronic kidney disease, unspecified: Secondary | ICD-10-CM | POA: Insufficient documentation

## 2014-10-31 DIAGNOSIS — Z8739 Personal history of other diseases of the musculoskeletal system and connective tissue: Secondary | ICD-10-CM | POA: Diagnosis not present

## 2014-10-31 DIAGNOSIS — I10 Essential (primary) hypertension: Secondary | ICD-10-CM

## 2014-10-31 DIAGNOSIS — Z880 Allergy status to penicillin: Secondary | ICD-10-CM | POA: Insufficient documentation

## 2014-10-31 DIAGNOSIS — Z79899 Other long term (current) drug therapy: Secondary | ICD-10-CM | POA: Diagnosis not present

## 2014-10-31 DIAGNOSIS — Z8719 Personal history of other diseases of the digestive system: Secondary | ICD-10-CM | POA: Diagnosis not present

## 2014-10-31 DIAGNOSIS — I129 Hypertensive chronic kidney disease with stage 1 through stage 4 chronic kidney disease, or unspecified chronic kidney disease: Secondary | ICD-10-CM | POA: Insufficient documentation

## 2014-10-31 LAB — BASIC METABOLIC PANEL
Anion gap: 7 (ref 5–15)
BUN: 34 mg/dL — ABNORMAL HIGH (ref 6–20)
CALCIUM: 9.8 mg/dL (ref 8.9–10.3)
CO2: 25 mmol/L (ref 22–32)
Chloride: 107 mmol/L (ref 101–111)
Creatinine, Ser: 1.13 mg/dL — ABNORMAL HIGH (ref 0.44–1.00)
GFR calc Af Amer: 51 mL/min — ABNORMAL LOW (ref >60)
GFR, EST NON AFRICAN AMERICAN: 44 mL/min — AB (ref >60)
Glucose, Bld: 98 mg/dL (ref 65–99)
POTASSIUM: 4.3 mmol/L (ref 3.5–5.1)
SODIUM: 139 mmol/L (ref 135–145)

## 2014-10-31 LAB — CBC WITH DIFFERENTIAL/PLATELET
BASOS ABS: 0 10*3/uL (ref 0.0–0.1)
Basophils Relative: 0 % (ref 0–1)
Eosinophils Absolute: 0.1 10*3/uL (ref 0.0–0.7)
Eosinophils Relative: 1 % (ref 0–5)
HCT: 33 % — ABNORMAL LOW (ref 36.0–46.0)
Hemoglobin: 10.9 g/dL — ABNORMAL LOW (ref 12.0–15.0)
LYMPHS PCT: 30 % (ref 12–46)
Lymphs Abs: 1.6 10*3/uL (ref 0.7–4.0)
MCH: 29.8 pg (ref 26.0–34.0)
MCHC: 33 g/dL (ref 30.0–36.0)
MCV: 90.2 fL (ref 78.0–100.0)
MONO ABS: 0.3 10*3/uL (ref 0.1–1.0)
Monocytes Relative: 5 % (ref 3–12)
NEUTROS PCT: 64 % (ref 43–77)
Neutro Abs: 3.4 10*3/uL (ref 1.7–7.7)
PLATELETS: 216 10*3/uL (ref 150–400)
RBC: 3.66 MIL/uL — ABNORMAL LOW (ref 3.87–5.11)
RDW: 16.6 % — ABNORMAL HIGH (ref 11.5–15.5)
WBC: 5.3 10*3/uL (ref 4.0–10.5)

## 2014-10-31 MED ORDER — CLONIDINE HCL 0.1 MG PO TABS
0.1000 mg | ORAL_TABLET | Freq: Once | ORAL | Status: DC
  Filled 2014-10-31: qty 1

## 2014-10-31 MED ORDER — CLONIDINE HCL 0.2 MG PO TABS
0.2000 mg | ORAL_TABLET | Freq: Once | ORAL | Status: AC
  Administered 2014-10-31: 0.2 mg via ORAL
  Filled 2014-10-31: qty 1

## 2014-10-31 NOTE — ED Provider Notes (Signed)
CSN: [id number]     Arrival date & time 10/31/14  1151 History   First MD Initiated Contact with Patient 10/31/14 1208     Chief Complaint  Patient presents with  . Hypertension     (Consider location/radiation/quality/duration/timing/severity/associated sxs/prior Treatment) Patient is a 81 y.o. female presenting with hypertension. The history is provided by the patient (the pt states her blood pressure has been high recently,  she also states her md took her off norvasc for swelling in legs).  Hypertension This is a recurrent problem. The current episode started 12 to 24 hours ago. The problem occurs constantly. The problem has not changed since onset.Pertinent negatives include no chest pain, no abdominal pain and no headaches. Nothing aggravates the symptoms. Nothing relieves the symptoms.    Past Medical History  Diagnosis Date  . Hypertension   . GERD (gastroesophageal reflux disease)   . Proteinuria   . Headache(784.0)   . Arthritis     knees,  shoulders  . Leg swelling   . Rectal bleeding   . Weakness   . Blood in stool   . Swelling     in legs   . Chronic kidney disease     chronic kidney disease   . Adenocarcinoma 09/13/2011    At the hepatic flexure  . Hypoglycemia    Past Surgical History  Procedure Laterality Date  . Total abdominal hysterectomy w/ bilateral salpingoophorectomy    . Esophagogastroduodenoscopy  06/20/2011    Procedure: ESOPHAGOGASTRODUODENOSCOPY (EGD);  Surgeon: Rogene Houston, MD;  [facility];  Service: Endoscopy;  Laterality: N/A;  . Colonoscopy  06/21/2011    Procedure: COLONOSCOPY;  Surgeon: Rogene Houston, MD;  [facility];  Service: Endoscopy;  Laterality: N/A;  . Abdominal hysterectomy  1963  . Hemicolectomy  09/13/2011  . Cataract extraction w/phaco Right 08/27/2012    Procedure: CATARACT EXTRACTION PHACO AND INTRAOCULAR LENS PLACEMENT (IOC);  Surgeon: Elta Guadeloupe T. Gershon Crane, MD;  Location: [facility];  Service: Ophthalmology;   Laterality: Right;  CDE:9.80  . Colonoscopy N/A 09/12/2013    Procedure: COLONOSCOPY;  Surgeon: Rogene Houston, MD;  [facility];  Service: Endoscopy;  Laterality: N/A;  60   Family History  Problem Relation Age of Onset  . Ovarian cancer Mother   . Lung cancer Brother    Social History  Substance Use Topics  . Smoking status: Never Smoker   . Smokeless tobacco: Never Used  . Alcohol Use: No   OB History    No data available     Review of Systems  Constitutional: Negative for appetite change and fatigue.  HENT: Negative for congestion, ear discharge and sinus pressure.   Eyes: Negative for discharge.  Respiratory: Negative for cough.   Cardiovascular: Negative for chest pain.  Gastrointestinal: Negative for abdominal pain and diarrhea.  Genitourinary: Negative for frequency and hematuria.       Hematuria  Musculoskeletal: Negative for back pain.  Skin: Negative for rash.  Neurological: Negative for seizures and headaches.  Psychiatric/Behavioral: Negative for hallucinations.      Allergies  Chocolate; Honey; Onion; Penicillins; Sulfonamide derivatives; Aspirin; and Codeine  Home Medications   Prior to Admission medications   Medication Sig Start Date End Date Taking? Authorizing Provider  cholecalciferol (VITAMIN D) 1000 UNITS tablet Take 1,000 Units by mouth daily.   Yes Historical Provider, MD  losartan-hydrochlorothiazide (HYZAAR) 100-25 MG per tablet Take 1 tablet by mouth daily. Patient takes around 1100am or 1200noon  Yes Historical Provider, MD  acetaminophen (TYLENOL) 500 MG tablet Take 500 mg by mouth every 4 (four) hours as needed. For pain    Historical Provider, MD  polyvinyl alcohol (LIQUID TEARS) 1.4 % ophthalmic solution Place 2 drops into both eyes daily as needed. For dry eyes    Historical Provider, MD  potassium chloride SA (K-DUR,KLOR-CON) 20 MEQ tablet Take 1 tablet (20 mEq total) by mouth daily. Patient not taking: Reported on  10/31/2014 09/22/13   Manon Hilding Kefalas, PA-C   BP 205/81 mmHg  Pulse 78  Temp(Src) 98 F (36.7 C) (Oral)  Resp 16  Ht 5\' 1"  (1.549 m)  Wt 147 lb (66.679 kg)  BMI 27.79 kg/m2  SpO2 100% Physical Exam  Constitutional: She is oriented to person, place, and time. She appears well-developed.  HENT:  Head: Normocephalic.  Eyes: Conjunctivae and EOM are normal. No scleral icterus.  Neck: Neck supple. No thyromegaly present.  Cardiovascular: Normal rate and regular rhythm.  Exam reveals no gallop and no friction rub.   No murmur heard. Pulmonary/Chest: No stridor. She has no wheezes. She has no rales. She exhibits no tenderness.  Abdominal: She exhibits no distension. There is no tenderness. There is no rebound.  Musculoskeletal: Normal range of motion. She exhibits no edema.  Lymphadenopathy:    She has no cervical adenopathy.  Neurological: She is oriented to person, place, and time. She exhibits normal muscle tone. Coordination normal.  Skin: No rash noted. No erythema.  Psychiatric: She has a normal mood and affect. Her behavior is normal.    ED Course  Procedures (including critical care time) Labs Review Labs Reviewed  CBC WITH DIFFERENTIAL/PLATELET - Abnormal; Notable for the following:    RBC 3.66 (*)    Hemoglobin 10.9 (*)    HCT 33.0 (*)    RDW 16.6 (*)    All other components within normal limits  BASIC METABOLIC PANEL - Abnormal; Notable for the following:    BUN 34 (*)    Creatinine, Ser 1.13 (*)    GFR calc non Af Amer 44 (*)    GFR calc Af Amer 51 (*)    All other components within normal limits    Imaging Review Dg Chest 2 View  10/31/2014   CLINICAL DATA:  Shortness of breath.  EXAM: CHEST  2 VIEW  COMPARISON:  None.  FINDINGS: The heart size and mediastinal contours are within normal limits. Both lungs are clear. No pneumothorax or pleural effusion is noted. The visualized skeletal structures are unremarkable.  IMPRESSION: No active cardiopulmonary disease.    Electronically Signed   By: Marijo Conception, M.D.   On: 10/31/2014 12:59   I, Carleta Woodrow L, personally reviewed and evaluated these images and lab results as part of my medical decision-making.   EKG Interpretation None      MDM   Final diagnoses:  None   Htn,  The pt states she wants to be admitted to hospital for her bp.  I spoke with the hospitalist and we will give the pt .1 mg clonodine and the hospitalist will come evaluate the pt     Milton Ferguson, MD 10/31/14 1536

## 2014-10-31 NOTE — Discharge Instructions (Signed)
Your blood pressure was corrected using Clonidine. Please continue to monitor your blood pressure at home. Please continue your losartan and Hydrocort thiazide as prescribed. If your blood pressure again becomes elevated higher than 190/100 please take your amlodipine. Please call Dr. Juel Burrow office on Monday or another physician to follow-up with your blood pressure and consider taking different medicines. Best of luck and God less.

## 2014-10-31 NOTE — ED Notes (Signed)
MD at bedside. 

## 2014-10-31 NOTE — ED Notes (Signed)
Pt states she is being seen today due to elevated blood pressure readings since last night. Last night pressure was 180/88 and 197/94. Present BP is 207/94. Pt states she has taken her medication. States she was sob earlier this morning.

## 2014-10-31 NOTE — ED Notes (Signed)
Patient agreeable to taking Clonidine at this time.

## 2014-10-31 NOTE — ED Notes (Signed)
Dr. Merrell at bedside. 

## 2014-10-31 NOTE — ED Notes (Signed)
Patient c/o of wanting to be admitted, worried about her BP. Patient has spoken with EDP and now wants to talk with charged nurse. Agricultural consultant notified.

## 2014-10-31 NOTE — ED Notes (Signed)
Patient discharge instructions reviewed, patient read entire discharge packet with great detail. Patient verbalized no further questions and understanding of instructions given by Dr. Marily Memos. Patient given coke to take for the ride and offered a wheelchair. Patient declined wheelchair.

## 2014-10-31 NOTE — Consult Note (Signed)
[facility] Consult Note  KAELY HOLLAN OHY:[id number] DOB: 13-Jun-1931 DOA: 10/31/2014  Referring physician: Dr. Roderic Palau - [facility] PCP: [name], MD   Chief Complaint: High blood pressure  HPI: Vicki Woods is a 81 y.o. female  Elevated blood pressure. Patient states that her blood pressure has slowly been increasing over the course of the last several weeks but noticed it climbs into the 200 range overnight last night. Patient had very little sleep due to the stress and worry of having high blood pressure. She checked her blood pressure multiple times throughout the night. Denies any active chest pain, shortness of breath, headache, nausea, vomiting, dizziness, LOC. Patient states that she was taking off of her amlodipine sometime ago due to it causing lower extremity swelling which she has a significant amount at baseline. States that amlodipine worked very well to lower her blood pressure. She states medical compliance with her hydrochlorthiazide. Problem is constant in getting worse. Nothing makes her symptoms that are outside of the amlodipine.   Review of Systems:  Constitutional:  No weight loss, night sweats, Fevers, chills, fatigue.  HEENT:  No headaches, Difficulty swallowing,Tooth/dental problems,Sore throat,  No sneezing, itching, ear ache, nasal congestion, post nasal drip,  Cardio-vascular:  No chest pain, Orthopnea, PND,anasarca, dizziness, palpitations  GI:  No heartburn, indigestion, abdominal pain, nausea, vomiting, diarrhea, change in bowel habits, loss of appetite  Resp:   No shortness of breath with exertion or at rest. No excess mucus, no productive cough, No non-productive cough, No coughing up of blood.No change in color of mucus.No wheezing.No chest wall deformity  Skin:  no rash or lesions.  GU:  no dysuria, change in color of urine, no urgency or frequency. No flank pain.  Musculoskeletal:   No joint pain or swelling. No decreased  range of motion. No back pain.  Psych:  No change in mood or affect. No depression or anxiety. No memory loss.   Past Medical History  Diagnosis Date  . Hypertension   . GERD (gastroesophageal reflux disease)   . Proteinuria   . Headache(784.0)   . Arthritis     knees,  shoulders  . Leg swelling   . Rectal bleeding   . Weakness   . Blood in stool   . Swelling     in legs   . Chronic kidney disease     chronic kidney disease   . Adenocarcinoma 09/13/2011    At the hepatic flexure  . Hypoglycemia    Past Surgical History  Procedure Laterality Date  . Total abdominal hysterectomy w/ bilateral salpingoophorectomy    . Esophagogastroduodenoscopy  06/20/2011    Procedure: ESOPHAGOGASTRODUODENOSCOPY (EGD);  Surgeon: Rogene Houston, MD;  [facility];  Service: Endoscopy;  Laterality: N/A;  . Colonoscopy  06/21/2011    Procedure: COLONOSCOPY;  Surgeon: Rogene Houston, MD;  [facility];  Service: Endoscopy;  Laterality: N/A;  . Abdominal hysterectomy  1963  . Hemicolectomy  09/13/2011  . Cataract extraction w/phaco Right 08/27/2012    Procedure: CATARACT EXTRACTION PHACO AND INTRAOCULAR LENS PLACEMENT (IOC);  Surgeon: Elta Guadeloupe T. Gershon Crane, MD;  Location: [facility];  Service: Ophthalmology;  Laterality: Right;  CDE:9.80  . Colonoscopy N/A 09/12/2013    Procedure: COLONOSCOPY;  Surgeon: Rogene Houston, MD;  [facility]: [facility];  Service: Endoscopy;  Laterality: N/A;  1240   Social History:  reports that she has never smoked. She has never used smokeless tobacco. She reports that she  does not drink alcohol or use illicit drugs.  Allergies  Allergen Reactions  . Chocolate Shortness Of Breath and Swelling  . Honey Shortness Of Breath  . Onion Shortness Of Breath  . Penicillins Anaphylaxis  . Sulfonamide Derivatives Anaphylaxis  . Aspirin     Former doctor told patient to stop due to allergy shots she was receiving.  . Codeine Anxiety    Family History  Problem  Relation Age of Onset  . Ovarian cancer Mother   . Lung cancer Brother      Prior to Admission medications   Medication Sig Start Date End Date Taking? Authorizing Provider  cholecalciferol (VITAMIN D) 1000 UNITS tablet Take 1,000 Units by mouth daily.   Yes Historical Provider, MD  losartan-hydrochlorothiazide (HYZAAR) 100-25 MG per tablet Take 1 tablet by mouth daily. Patient takes around 1100am or 1200noon   Yes Historical Provider, MD  acetaminophen (TYLENOL) 500 MG tablet Take 500 mg by mouth every 4 (four) hours as needed. For pain    Historical Provider, MD  polyvinyl alcohol (LIQUID TEARS) 1.4 % ophthalmic solution Place 2 drops into both eyes daily as needed. For dry eyes    Historical Provider, MD  potassium chloride SA (K-DUR,KLOR-CON) 20 MEQ tablet Take 1 tablet (20 mEq total) by mouth daily. Patient not taking: Reported on 10/31/2014 09/22/13   Baird Cancer, PA-C   Physical Exam: Filed Vitals:   10/31/14 1600 10/31/14 1615 10/31/14 1630 10/31/14 1642  BP: 186/75 194/72 166/83 165/74  Pulse:      Temp:      TempSrc:      Resp:      Height:      Weight:      SpO2:        Wt Readings from Last 3 Encounters:  10/31/14 66.679 kg (147 lb)  09/23/14 68.539 kg (151 lb 1.6 oz)  03/25/14 66.089 kg (145 lb 11.2 oz)    General:  Patient is anxious  Eyes:  PERRL, normal lids, irises & conjunctiva ENT:  grossly normal hearing, lips & tongue Neck:  no LAD, masses or thyromegaly Cardiovascular:  RRR, no m/r/g. 2+ lower extremity pitting edema bilaterally. Respiratory:  CTA bilaterally, no w/r/r. Normal respiratory effort. Abdomen:  soft, ntnd Skin:  no rash or induration seen on limited exam Musculoskeletal:  grossly normal tone BUE/BLE Psychiatric:  grossly normal mood and affect, speech fluent and appropriate Neurologic:  grossly non-focal.          Labs on Admission:  Basic Metabolic Panel:  Recent Labs Lab 10/31/14 1225  NA 139  K 4.3  CL 107  CO2 25    GLUCOSE 98  BUN 34*  CREATININE 1.13*  CALCIUM 9.8   Liver Function Tests: No results for input(s): AST, ALT, ALKPHOS, BILITOT, PROT, ALBUMIN in the last 168 hours. No results for input(s): LIPASE, AMYLASE in the last 168 hours. No results for input(s): AMMONIA in the last 168 hours. CBC:  Recent Labs Lab 10/31/14 1225  WBC 5.3  NEUTROABS 3.4  HGB 10.9*  HCT 33.0*  MCV 90.2  PLT 216   Cardiac Enzymes: No results for input(s): CKTOTAL, CKMB, CKMBINDEX, TROPONINI in the last 168 hours.  BNP (last 3 results) No results for input(s): BNP in the last 8760 hours.  ProBNP (last 3 results) No results for input(s): PROBNP in the last 8760 hours.  CBG: No results for input(s): GLUCAP in the last 168 hours.  Radiological Exams on Admission: Dg Chest 2 View  10/31/2014   CLINICAL DATA:  Shortness of breath.  EXAM: CHEST  2 VIEW  COMPARISON:  None.  FINDINGS: The heart size and mediastinal contours are within normal limits. Both lungs are clear. No pneumothorax or pleural effusion is noted. The visualized skeletal structures are unremarkable.  IMPRESSION: No active cardiopulmonary disease.   Electronically Signed   By: Marijo Conception, M.D.   On: 10/31/2014 12:59     Assessment/Plan Active Problems:   Hypertension, essential   Renal insufficiency   Dependent edema   Called to ED to evaluate patient for persistent hypertension. After consultation with patient and ED physician recommending patient be given 0.2 mg clonidine. Patient is very concerned that her blood pressure may result in something "bad "happening to her. Significant reassurance given to the patient concerning intermittent elevations of blood pressure in the range that she is currently in. Significant improvement in blood pressure after administration of clonidine (sustained at 160/70). Recommend patient continue on her losartan and hydrochlorthiazide and take a half to single dose of her amlodipine as needed for blood  pressures greater than 190/100. Patient to call her primary care physician on Monday for follow-up appointment within 1 week for further blood pressure management. Mild renal insufficiency noted on lab work, creatinine 1.13. Likely secondary to some persistent hypertension. Encouraged fluid intake. Patient's bilateral lower extremity edema is fairly significant and recommended patient use compression stockings to help with this as opposed to further diuretic such as Lasix. Patient aware that this will worsen if she goes back on her amlodipine.  Family Communication: Daughter Disposition Plan: discharge  Kazuto Sevey Lenna Sciara, MD Family Medicine [facility] [email or web address] Password [email or web address]

## 2014-10-31 NOTE — ED Notes (Signed)
Will recheck BP and page Dr. Marily Memos 30 minutes after clonidine administration.

## 2014-10-31 NOTE — ED Notes (Signed)
Patient given something to eat and drink per EDP.

## 2014-10-31 NOTE — ED Notes (Signed)
Dr. Marily Memos notified of BP, he is coming to talk with patient.

## 2014-10-31 NOTE — ED Notes (Signed)
Patient escorted out to car by this RN.

## 2014-10-31 NOTE — ED Notes (Signed)
Dr. Marily Memos and Hulan Amato, the [facility] here to speak with patient.

## 2014-11-05 ENCOUNTER — Encounter: Payer: Self-pay | Admitting: Cardiology

## 2014-11-06 ENCOUNTER — Ambulatory Visit (INDEPENDENT_AMBULATORY_CARE_PROVIDER_SITE_OTHER): Payer: Medicare Other | Admitting: Cardiology

## 2014-11-06 ENCOUNTER — Encounter: Payer: Self-pay | Admitting: Cardiology

## 2014-11-06 VITALS — BP 148/88 | HR 91 | Ht 61.0 in | Wt 154.4 lb

## 2014-11-06 DIAGNOSIS — R600 Localized edema: Secondary | ICD-10-CM | POA: Diagnosis not present

## 2014-11-06 DIAGNOSIS — N183 Chronic kidney disease, stage 3 (moderate): Secondary | ICD-10-CM

## 2014-11-06 DIAGNOSIS — I10 Essential (primary) hypertension: Secondary | ICD-10-CM | POA: Diagnosis not present

## 2014-11-06 NOTE — Progress Notes (Addendum)
Cardiology Office Note  Date: 11/06/2014   ID: Vicki Woods, DOB September 02, 1931, MRN [id number]  PCP: Vicki Cahill, MD  Consulting Cardiologist: Vicki Lesches, MD   Chief Complaint  Patient presents with  . Cardiac evaluation    History of Present Illness: Vicki Woods is an 81 y.o. female referred to the office for evaluation, either by Dr. Nevada Woods or Dr. Theador Woods, details are not clear. Vicki Woods presents with a fairly convoluted history, is a tangential historian that is difficult for me to follow. I see that she was in the ER recently with elevated blood pressure and medication adjustments were recommended. She has a history of CKD stage 3 with proteinuria and also essential hypertension. There is mention in some of the records about congestive heart failure, but the details are not clear. She reports having chronic leg edema for at least 5 years. She also tells me that she was previously on Norvasc at 5 mg daily although this was stopped to see if it might help her leg edema which it did not. Her most recent antihypertensive agent is Hyzaar and she states that she is taking this regularly.  Lower extremity venous studies are noted below, negative for DVT or venous reflux. Chest x-ray from ER visit showed no acute findings. Could not locate any recent echocardiograms. She thinks that she may have seen Vicki Woods many years ago but cannot recall the details.  She seemed confused about knowing what to do with her medications, states that she has been told conflicting information by different providers. We did discuss her medications today and potential adjustments, but she was hesitant to follow any of my recommendations either.   Past Medical History  Diagnosis Date  . Essential hypertension   . GERD (gastroesophageal reflux disease)   . Proteinuria   . Headache(784.0)   . Arthritis     Knees and shoulders  . Leg swelling   . CKD (chronic kidney disease) stage 3, GFR 30-59  ml/min   . Adenocarcinoma of colon 09/13/2011    Hepatic flexure    Past Surgical History  Procedure Laterality Date  . Total abdominal hysterectomy w/ bilateral salpingoophorectomy    . Esophagogastroduodenoscopy  06/20/2011    Procedure: ESOPHAGOGASTRODUODENOSCOPY (EGD);  Surgeon: Vicki Houston, MD;  [facility];  Service: Endoscopy;  Laterality: N/A;  . Colonoscopy  06/21/2011    Procedure: COLONOSCOPY;  Surgeon: Vicki Houston, MD;  Location: [facility];  Service: Endoscopy;  Laterality: N/A;  . Hemicolectomy  09/13/2011  . Cataract extraction w/phaco Right 08/27/2012    Procedure: CATARACT EXTRACTION PHACO AND INTRAOCULAR LENS PLACEMENT (IOC);  Surgeon: Vicki Guadeloupe T. Gershon Crane, MD;  Location: [facility];  Service: Ophthalmology;  Laterality: Right;  CDE:9.80  . Colonoscopy N/A 09/12/2013    Procedure: COLONOSCOPY;  Surgeon: Vicki Houston, MD;  Location: [facility];  Service: Endoscopy;  Laterality: N/A;  1240    Current Outpatient Prescriptions  Medication Sig Dispense Refill  . acetaminophen (TYLENOL) 500 MG tablet Take 500 mg by mouth every 4 (four) hours as needed. For pain    . cholecalciferol (VITAMIN D) 1000 UNITS tablet Take 1,000 Units by mouth daily.    Marland Kitchen losartan-hydrochlorothiazide (HYZAAR) 100-25 MG per tablet Take 1 tablet by mouth daily. Patient takes around 1100am or 1200noon    . polyvinyl alcohol (LIQUID TEARS) 1.4 % ophthalmic solution Place 2 drops into both eyes daily as needed. For dry eyes     No  current facility-administered medications for this visit.    Allergies:  Chocolate; Honey; Onion; Penicillins; Sulfonamide derivatives; Aspirin; and Codeine   Social History: The patient  reports that she has never smoked. She has never used smokeless tobacco. She reports that she does not drink alcohol or use illicit drugs.   Family History: The patient's family history includes Lung cancer in her brother; Ovarian cancer in her mother.   ROS:  Please see the  history of present illness. Otherwise, complete review of systems is positive for episodes of weakness, she states that her "blood sugar gets low. "  All other systems are reviewed and negative.   Physical Exam: VS:  BP 148/88 mmHg  Pulse 91  Ht 5\' 1"  (1.549 m)  Wt 154 lb 6.4 oz (70.035 kg)  BMI 29.19 kg/m2  SpO2 99%, BMI Body mass index is 29.19 kg/(m^2).  Wt Readings from Last 3 Encounters:  11/06/14 154 lb 6.4 oz (70.035 kg)  10/31/14 147 lb (66.679 kg)  09/23/14 151 lb 1.6 oz (68.539 kg)     General: Patient appears comfortable at rest. HEENT: Conjunctiva and lids normal, oropharynx clear. Neck: Supple, JVP 10 cm water, no carotid bruits, no thyromegaly. Lungs: Clear to auscultation, nonlabored breathing at rest. Cardiac: Regular rate and rhythm, S4, 2/6 systolic murmur, no pericardial rub. Abdomen: Soft, nontender, bowel sounds present, no guarding or rebound. Extremities: Chronic appearing edema below the knees bilaterally, looks like lymphedema but does not extend into the foot or toes however. Fairly firm, nontender, distal pulses 1+. Skin: Warm and dry. Musculoskeletal: No kyphosis. Neuropsychiatric: Alert and oriented x3, affect grossly appropriate.   ECG: Tracing from 08/22/2012 showed normal sinus rhythm with right bundle block.   Recent Labwork: 09/23/2014: ALT 12*; AST 22 10/31/2014: BUN 34*; Creatinine, Ser 1.13*; Hemoglobin 10.9*; Platelets 216; Potassium 4.3; Sodium 139   Other Studies Reviewed Today:  Lower extremity venous Dopplers showed no evidence of DVT bilaterally in June 2016.  Chest x-ray 10/31/2014: FINDINGS: The heart size and mediastinal contours are within normal limits. Both lungs are clear. No pneumothorax or pleural effusion is noted. The visualized skeletal structures are unremarkable.  IMPRESSION: No active cardiopulmonary disease.  Assessment and Plan:  1. Patient here for presumed cardiac evaluation based on very limited information.  Difficult to follow her history as outlined above. What is apparent is that she has a fairly long-standing history of hypertension and also renal insufficiency with associated proteinuria, also indicates chronic leg edema for at least 5 years. Chest x-ray shows no acute findings, no evidence of venous occlusive disease by recent lower extremity Dopplers. We will obtain an echocardiogram to further assess cardiac structure and function and see if there are any contributors. Otherwise, she did not want to make any medication adjustments, although we did discuss other options. I indicated to her that she needs to follow closely with her primary care provider since she has had confusion about what to do with her medications based on assessment by different providers over time.  2. Probable essential hypertension. She is on Hyzaar at this time, a reasonable choice, and is followed by nephrology. Norvasc is also reasonable, and it is not entirely clear how much it was affecting her leg edema since this looks to be a chronic problem. Might be with considering trying a lower dose such as 2.5-5 mg.  3. CKD stage 3 with proteinuria, followed by Dr. Theador Woods.  Current medicines were reviewed with the patient today.   Orders Placed This  Encounter  Procedures  . Echocardiogram    Disposition: Call with results.   Signed, Satira Sark, MD, [facility] 11/06/2014 10:00 AM    [address] [facility] [address], [place], [place] [address] Phone: [phone]; Fax: [phone]

## 2014-11-06 NOTE — Patient Instructions (Signed)
Your physician recommends that you schedule a follow-up appointment in: to be determined after test,we will call you with results     Your physician recommends that you continue on your current medications as directed. Please refer to the Current Medication list given to you today.     Your physician has requested that you have an echocardiogram. Echocardiography is a painless test that uses sound waves to create images of your heart. It provides your doctor with information about the size and shape of your heart and how well your heart's chambers and valves are working. This procedure takes approximately one hour. There are no restrictions for this procedure.       Thank you for choosing [facility] !         

## 2014-11-10 ENCOUNTER — Ambulatory Visit (HOSPITAL_COMMUNITY)
Admission: RE | Admit: 2014-11-10 | Discharge: 2014-11-10 | Disposition: A | Discharge status: Home / Self Care | Payer: Medicare Other | Source: Ambulatory Visit | Attending: Cardiology | Admitting: Cardiology

## 2014-11-10 DIAGNOSIS — R609 Edema, unspecified: Secondary | ICD-10-CM | POA: Diagnosis present

## 2014-11-10 DIAGNOSIS — R600 Localized edema: Secondary | ICD-10-CM | POA: Diagnosis not present

## 2014-11-11 ENCOUNTER — Telehealth: Payer: Self-pay | Admitting: Cardiology

## 2014-11-11 NOTE — Telephone Encounter (Signed)
Returning someones call 

## 2014-11-11 NOTE — Patient Outreach (Signed)
[place] [facility]) Care Management  11/11/2014  MADISSEN WYSE 19-Sep-1931 [id number]   Referral from MD, assigned to Sherrin Daisy, RNCM and Deanne Coffer, PharmD for patient outreach.  Kaynen Minner L. Chauntelle Azpeitia, [place] Care Management Assistant

## 2014-11-18 ENCOUNTER — Other Ambulatory Visit: Payer: Self-pay | Admitting: Pharmacist

## 2014-11-18 NOTE — Patient Outreach (Signed)
[place] [facility]) Care Management  [place]   11/18/2014  AME HEAGLE 10-06-1931 [id number]  Subjective: Vicki Woods is a 81 y.o. female who was referred to [place] for medication education.    Objective:   Current Medications: Current Outpatient Prescriptions  Medication Sig Dispense Refill  . acetaminophen (TYLENOL) 500 MG tablet Take 500 mg by mouth every 4 (four) hours as needed. For pain    . cholecalciferol (VITAMIN D) 1000 UNITS tablet Take 1,000 Units by mouth daily.    Marland Kitchen losartan-hydrochlorothiazide (HYZAAR) 100-25 MG per tablet Take 1 tablet by mouth daily. Patient takes around 1100am or 1200noon    . polyvinyl alcohol (LIQUID TEARS) 1.4 % ophthalmic solution Place 2 drops into both eyes daily as needed. For dry eyes     No current facility-administered medications for this visit.    Functional Status: No flowsheet data found.  Fall/Depression Screening: PHQ 2/9 Scores 03/25/2014  PHQ - 2 Score 0    Assessment:  Drugs sorted by system:  Neurologic/Psychologic: none noted  Cardiovascular: losartan-hydrochlorothiazide 100-25 mg  Pulmonary/Allergy: none noted  Gastrointestinal: none noted  Endocrine: none noted  Renal: noted noted   Topical: polyvinyl alcohol (Liquid Tears)  Pain: acetaminophen 500 mg q4h PRN  Miscellaneous: cholecalciferol 0141 units   Duplications in therapy: none Gaps in therapy: aspirin but patient has hx of GI bleed. Unsure of lipids and whether statin may be indicated - however patient is resistant to additional meds (extensively documented in chart) Medications to avoid in the elderly: none Drug interactions:none Other issues noted: patient resistant to changes in her medical care  1. Medication education: unable to asess   Plan: 1. Medication education: I called the patient to discuss [facility] services and I was unable to leave a message.  I will reach out on 11/20/14  if the patient  does not return my call today.    Nicoletta Ba, PharmD, [facility] [phone]

## 2014-11-24 ENCOUNTER — Other Ambulatory Visit: Payer: Self-pay | Admitting: *Deleted

## 2014-11-24 NOTE — Patient Outreach (Addendum)
[place] [facility]) Care Management  11/24/2014  Vicki Woods 1931-11-06 [id number]  MD referral.:   Telephone to patient who was advised of reason for call and of [facility] care management services.  Patient voices that she appreciates the call but does not need case management/disease management services at this time. States her health condition is under control & she knows how to manage her medications.    Patient has declined [facility] services. Does not wish to take contact phone number.  [facility] pharmacist notified of declination of services. Juliann Pulse in primary care office notified of the above.  Plan: will close case and send MD closure letter.  Sherrin Daisy, RN BSN [place] Management Coordinator [facility] Care Management  [phone]

## 2014-11-25 ENCOUNTER — Encounter: Payer: Self-pay | Admitting: *Deleted

## 2014-11-27 ENCOUNTER — Other Ambulatory Visit: Payer: Self-pay | Admitting: Pharmacist

## 2014-11-27 NOTE — Patient Outreach (Signed)
Received update from Sherrin Daisy, [facility], and patient has declined all [facility] Services.  Will close pharmacy program.  Nicoletta Ba, PharmD, [place] [phone]

## 2014-11-30 NOTE — Patient Outreach (Signed)
[facility] [facility]) Care Management  11/30/2014  Vicki Woods September 04, 1931 [id number]   Telephone outreach to patient to assist in completing an extra help application. I had to leave a HIPPA compliant message for her to call me back.  Kimerly Rowand L. Jedd Schulenburg, [place] Care Management Assistant

## 2014-12-01 NOTE — Telephone Encounter (Signed)
Patient calls stating that her blood pressure continues to stay high. States that he is having a headache.

## 2014-12-01 NOTE — Telephone Encounter (Signed)
Pt agreed to call Dr. Nevada Crane about BP problems and also nephrologist. Says she now wants to take amlodipine again. Pt agreeable that to many med changes would only confuse things

## 2014-12-01 NOTE — Telephone Encounter (Signed)
Pt c/o of headache says BP 158/74 163/74 178/74 180/84 readings since this morning. Denies CP/dizziness, noticed some swelling around ankles. Pt has taken Hyzaar today. Will forward to Dr. Domenic Polite

## 2014-12-01 NOTE — Telephone Encounter (Signed)
No further cardiac workup is planned at this point. It does sound like her blood pressure has been elevated, and I would recommend as I did before that she follow-up with her primary care provider and nephrologist for further medication adjustments. Hyzaar may need to be adjusted, or perhaps she could consider going back on lower dose Norvasc. I would defer any changes to her primary care provider or nephrologist. Too many providers involved in adjusting her medications is only going to confuse the matter.

## 2014-12-01 NOTE — Patient Outreach (Signed)
[place] [facility]) Care Management  12/01/2014  Vicki Woods [birth year] [id number]   Telephone outreach to patient to assist in completing the extra help application and patient refused my services.   [name] L. Eyonna Sandstrom, [place] Care Management Assistant

## 2014-12-02 ENCOUNTER — Encounter: Payer: Self-pay | Admitting: *Deleted

## 2014-12-08 ENCOUNTER — Telehealth (HOSPITAL_COMMUNITY): Payer: Self-pay

## 2014-12-08 NOTE — Telephone Encounter (Signed)
Message left for patient that it should not affect her cancer.  To call back with any questions.

## 2014-12-08 NOTE — Telephone Encounter (Signed)
-----   Message from Epifanio Lesches sent at 12/07/2014  2:32 PM EDT ----- Pt is newly diagnosed with hypoglycemia and is concerned about how it will or if it will affect her cancer

## 2015-03-26 ENCOUNTER — Other Ambulatory Visit (HOSPITAL_COMMUNITY): Payer: Medicare Other

## 2015-03-26 ENCOUNTER — Ambulatory Visit (HOSPITAL_COMMUNITY): Payer: Medicare Other | Admitting: Hematology & Oncology

## 2015-03-29 ENCOUNTER — Ambulatory Visit (HOSPITAL_COMMUNITY): Payer: Medicare Other | Admitting: Hematology & Oncology

## 2015-04-14 ENCOUNTER — Encounter (HOSPITAL_COMMUNITY): Payer: Medicare Other

## 2015-04-14 ENCOUNTER — Encounter (HOSPITAL_COMMUNITY): Payer: Medicare Other | Attending: Hematology & Oncology | Admitting: Hematology & Oncology

## 2015-04-14 ENCOUNTER — Encounter (HOSPITAL_COMMUNITY): Payer: Self-pay | Admitting: Hematology & Oncology

## 2015-04-14 VITALS — BP 224/78 | HR 89 | Temp 97.4°F | Resp 18 | Wt 159.0 lb

## 2015-04-14 DIAGNOSIS — D500 Iron deficiency anemia secondary to blood loss (chronic): Secondary | ICD-10-CM

## 2015-04-14 LAB — CBC WITH DIFFERENTIAL/PLATELET
Basophils Absolute: 0 10*3/uL (ref 0.0–0.1)
Basophils Relative: 0 %
EOS ABS: 0.1 10*3/uL (ref 0.0–0.7)
EOS PCT: 1 %
HCT: 33.1 % — ABNORMAL LOW (ref 36.0–46.0)
Hemoglobin: 11 g/dL — ABNORMAL LOW (ref 12.0–15.0)
LYMPHS ABS: 1.2 10*3/uL (ref 0.7–4.0)
Lymphocytes Relative: 23 %
MCH: 29.8 pg (ref 26.0–34.0)
MCHC: 33.2 g/dL (ref 30.0–36.0)
MCV: 89.7 fL (ref 78.0–100.0)
MONO ABS: 0.2 10*3/uL (ref 0.1–1.0)
MONOS PCT: 4 %
Neutro Abs: 3.8 10*3/uL (ref 1.7–7.7)
Neutrophils Relative %: 72 %
PLATELETS: 239 10*3/uL (ref 150–400)
RBC: 3.69 MIL/uL — ABNORMAL LOW (ref 3.87–5.11)
RDW: 15.8 % — ABNORMAL HIGH (ref 11.5–15.5)
WBC: 5.2 10*3/uL (ref 4.0–10.5)

## 2015-04-14 LAB — COMPREHENSIVE METABOLIC PANEL
ALT: 11 U/L — ABNORMAL LOW (ref 14–54)
ANION GAP: 7 (ref 5–15)
AST: 20 U/L (ref 15–41)
Albumin: 3.6 g/dL (ref 3.5–5.0)
Alkaline Phosphatase: 66 U/L (ref 38–126)
BUN: 30 mg/dL — ABNORMAL HIGH (ref 6–20)
CALCIUM: 9.8 mg/dL (ref 8.9–10.3)
CHLORIDE: 108 mmol/L (ref 101–111)
CO2: 27 mmol/L (ref 22–32)
Creatinine, Ser: 1.1 mg/dL — ABNORMAL HIGH (ref 0.44–1.00)
GFR calc non Af Amer: 46 mL/min — ABNORMAL LOW (ref >60)
GFR, EST AFRICAN AMERICAN: 53 mL/min — AB (ref >60)
Glucose, Bld: 97 mg/dL (ref 65–99)
Potassium: 4 mmol/L (ref 3.5–5.1)
SODIUM: 142 mmol/L (ref 135–145)
Total Bilirubin: 0.7 mg/dL (ref 0.3–1.2)
Total Protein: 6.7 g/dL (ref 6.5–8.1)

## 2015-04-14 NOTE — Progress Notes (Signed)
[facility] [place], MD [address] [place] [id number]  Iron deficiency anemia due to chronic blood loss - Plan: CBC with Differential, CEA, Comprehensive metabolic panel  CURRENT THERAPY: Surveillance per NCCN guidelines  INTERVAL HISTORY: Vicki Woods 82 y.o. female returns for followup of Stage II A (T3N0M0) colon cancer, S/P right hemicolectomy 09/13/2011 by Dr. Hassell Done. 16 nodes were negative no LV I was seen but she did have perineural space invasion found and a 4.5 cm tumor. This was a grade 1 tumor, but ulcerated. 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 There is no family history of colon cancer that she is aware of. She has no children.  She has been resistant to surveillance per NCCN guidelines.    Right hemicolectomy June 2013 for node negative adenocarcinoma   06/21/2011 Initial Diagnosis Colonoscopy by Dr. Laural Golden- hepatic flexure polypectomy reveals adenocarcinoma arising from tubular adenoma.   06/22/2011 Imaging CT- Minimal amount of fluid in Morison's pouch and small amount of fluid within the pelvis.  Etiology indeterminate.  Too small to characterize liver lesions possibly 2 small cysts although metastatic lesions not ruled out.     09/13/2011 Definitive Surgery R ascending colon resection by Dr. Johnathan Hausen demonstraing an ulcerative, invasive, well-differentiated adenocarcinoma measuring 4.5 cm.  No LVI, but perineural invasion noted.  0/16 lymph nodes.  Clear margins.   09/12/2013 Survivorship Colonoscopy by Dr. Laural Golden- tubular adenoma, not dysplasia or malignancy.   03/25/2014 Tumor Marker CEA- 6.3   03/31/2014 Imaging CT abd/pelvis- No evidence of recurrent or metastatic carcinoma within the abdomen or pelvis. No other acute findings identified. Stable benign bilateral adrenal gland hyperplasia versus adenomas.    Her CEA in Jan 2016 demonstrated signs of increase  and therefore CT scans were ordered and scheduled.  CT abd/pelvis was completed and negative for recurrence.  CT of chest was ordered, but not completed, unknown to me.  Vicki Woods returns to the [facility] today alone.  She says that one doctor tells her "I don't want you to lose any more weight." She says that her weight loss has something to do with her fluid retention and swelling, talking about how she used to drink a significantly large amount of water. She also comments on how she loves salt, specifically mentioning garlic salt. Her lower legs are visibly swollen, and she says that this has always been an issue for her.  She confirms she gets mammograms yearly. She complains of a recent "pinching" sensation on the tip of her nipple, about 3-4 times over the past week. She has not set up her next mammogram appointment yet. She does not want [place] to arrange it either.   She says she has some wipes and Preparation H from Dr. Nevada Crane for her hemorrhoids. She denies any bleeding in association with her hemmorhoids, just itching and burning. She says that she can get constipated if she eats cheese, but she tries to fight this by eating high fiber cereal in the morning.  She lives by herself and has no family, with her husband and all of her relatives deceased. In spite of this, she says she is very active and able to do everything for herself, and that her faith in God keeps her going when she feels down. She remarks that the holidays were a little difficult but she's doing well again now.  She isn't interested in  pursuing any work-up today, she just wants to "wait until all the blood work is back."   Past Medical History  Diagnosis Date  . Essential hypertension   . GERD (gastroesophageal reflux disease)   . Proteinuria   . Headache(784.0)   . Arthritis     Knees and shoulders  . Leg swelling   . CKD (chronic kidney disease) stage 3, GFR 30-59 ml/min   . Adenocarcinoma of colon ([place])  09/13/2011    Hepatic flexure    has HYPERTENSION; CARDIAC MURMUR; HEARTBURN; GI bleed; Anemia; Obesity; Right hemicolectomy June 2013 for node negative adenocarcinoma; Hypertension, essential; Renal insufficiency; and Dependent edema on her problem list.     is allergic to chocolate; honey; onion; penicillins; sulfonamide derivatives; aspirin; and codeine.  Vicki Woods does not currently have medications on file.  Past Surgical History  Procedure Laterality Date  . Total abdominal hysterectomy w/ bilateral salpingoophorectomy    . Esophagogastroduodenoscopy  06/20/2011    Procedure: ESOPHAGOGASTRODUODENOSCOPY (EGD);  Surgeon: Rogene Houston, MD;  Location: [facility];  Service: Endoscopy;  Laterality: N/A;  . Colonoscopy  06/21/2011    Procedure: COLONOSCOPY;  Surgeon: Rogene Houston, MD;  Location: [facility];  Service: Endoscopy;  Laterality: N/A;  . Hemicolectomy  09/13/2011  . Cataract extraction w/phaco Right 08/27/2012    Procedure: CATARACT EXTRACTION PHACO AND INTRAOCULAR LENS PLACEMENT (IOC);  Surgeon: Elta Guadeloupe T. Gershon Crane, MD;  Location: [facility];  Service: Ophthalmology;  Laterality: Right;  CDE:9.80  . Colonoscopy N/A 09/12/2013    Procedure: COLONOSCOPY;  Surgeon: Rogene Houston, MD;  Location: [facility];  Service: Endoscopy;  Laterality: N/A;  1240    Denies any headaches, dizziness, double vision, fevers, chills, night sweats, nausea, vomiting, diarrhea, constipation, chest pain, heart palpitations, shortness of breath, blood in stool, black tarry stool, urinary pain, urinary burning, urinary frequency, hematuria.  14 point review of systems was performed and is negative except as detailed under history of present illness and above    PHYSICAL EXAMINATION  ECOG PERFORMANCE STATUS: 1 - Symptomatic but completely ambulatory  Filed Vitals:   04/14/15 1337  BP: 224/78  Pulse: 89  Temp: 97.4 F (36.3 C)  Resp: 18    GENERAL:alert, no distress, well nourished, well  developed, comfortable, cooperative and smiling SKIN: skin color, texture, turgor are normal, no rashes or significant lesions HEAD: Normocephalic, No masses, lesions, tenderness or abnormalities EYES: normal, PERRLA, EOMI, Conjunctiva are pink and non-injected EARS: External ears normal OROPHARYNX:lips, buccal mucosa, and tongue normal and mucous membranes are moist  NECK: supple, no adenopathy, thyroid normal size, non-tender, without nodularity, no stridor, non-tender, trachea midline LYMPH:  no palpable lymphadenopathy, no hepatosplenomegaly BREAST:not examined LUNGS: clear to auscultation and percussion HEART: regular rate & rhythm, no murmurs, no gallops, S1 normal and S2 normal ABDOMEN:abdomen soft, non-tender, normal bowel sounds, no masses or organomegaly and no hepatosplenomegaly BACK: Back symmetric, no curvature., No CVA tenderness EXTREMITIES:less then 2 second capillary refill, no joint deformities, effusion, or inflammation, no clubbing, no cyanosis, positive findings:    edema B/L LE edema, 2+ pitting.  NEURO: alert & oriented x 3 with fluent speech, no focal motor/sensory deficits    LABORATORY DATA: I have reviewed the data as listed.  CBC    Component Value Date/Time   WBC 5.2 04/14/2015 1222   RBC 3.69* 04/14/2015 1222   HGB 11.0* 04/14/2015 1222   HCT 33.1* 04/14/2015 1222   PLT 239 04/14/2015 1222   MCV 89.7  04/14/2015 1222   MCH 29.8 04/14/2015 1222   MCHC 33.2 04/14/2015 1222   RDW 15.8* 04/14/2015 1222   LYMPHSABS 1.2 04/14/2015 1222   MONOABS 0.2 04/14/2015 1222   EOSABS 0.1 04/14/2015 1222   BASOSABS 0.0 04/14/2015 1222      Chemistry      Component Value Date/Time   NA 142 04/14/2015 1222   K 4.0 04/14/2015 1222   CL 108 04/14/2015 1222   CO2 27 04/14/2015 1222   BUN 30* 04/14/2015 1222   CREATININE 1.10* 04/14/2015 1222      Component Value Date/Time   CALCIUM 9.8 04/14/2015 1222   ALKPHOS 66 04/14/2015 1222   AST 20 04/14/2015 1222    ALT 11* 04/14/2015 1222   BILITOT 0.7 04/14/2015 1222     Lab Results  Component Value Date   CEA 6.0* 09/23/2014      ASSESSMENT AND PLAN:  Stage II A (T3N0M0) colon cancer, S/P right hemicolectomy 09/13/2011 by Dr. Hassell Done. Last mammogram 01/2013 Elevated CEA, uncertain significance CT scans abdomen/pelvis 03/31/2014 with no evidence recurrent or metastatic carcinoma  She is reluctant to do additional imaging in regards to her CRC follow-up. We will notify her of her CEA. Her last mammogram was in 2014. She was not willing to let me examine her breast nor schedule her for a mammogram. She notes she is going to schedule her mammogram here at [facility].  I have asked her to let us know if she has any concerns moving forward and we can work her in.  I have scheduled her for 6 month follow-up.  THERAPY PLAN:  NCCN guidelines for surveillance for T3/T4, N0-2, M0 colorectal cancer are:   A. H+P every 3-6 months x 2 years and then every 6 months for a total of 5 years   B. CEA every 3 months x 2 years and then every 6 months for a total of 5 years   C. CT abd/pelvis annually for up to 5 years   D. Colonoscopy in 1 year except if no preoperative colonoscopy due to obstructing lesion, colonoscopy in 3-6 months.    1. If advanced adenoma, repeat in 1 year   2. If no advanced adenoma, repeat in 3 years, then every 5 years  E. PET scan not routinely recommended.   Orders Placed This Encounter  Procedures  . CBC with Differential    Standing Status: Future     Number of Occurrences:      Standing Expiration Date: 04/13/2016  . CEA    Standing Status: Future     Number of Occurrences:      Standing Expiration Date: 04/13/2016  . Comprehensive metabolic panel    Standing Status: Future     Number of Occurrences:      Standing Expiration Date: 04/13/2016   All questions were answered. The patient knows to call the clinic with any problems, questions or concerns. We can certainly see the patient  much sooner if necessary.  This document serves as a record of services personally performed by Ancil Linsey, MD. It was created on her behalf by Toni Amend, a trained medical scribe. The creation of this record is based on the scribe's personal observations and the provider's statements to them. This document has been checked and approved by the attending provider.  I have reviewed the above documentation for accuracy and completeness, and I agree with the above.  This note was electronically signed.  Molli Hazard, MD  04/14/2015

## 2015-04-14 NOTE — Patient Instructions (Addendum)
[facility] at [facility] Discharge Instructions  RECOMMENDATIONS MADE BY THE CONSULTANT AND ANY TEST RESULTS WILL BE SENT TO YOUR REFERRING PHYSICIAN.   Exam and discussion completed by Dr Whitney Muse today You can try witch hazel wipes, you can get these over the counter If your labs that come back tomorrow are elevated more than they have been then we will call you and get those CT scans of your abdomen and pelvis.  Return to see the doctor in 6 months with labs. Please call the clinic if you have any questions or concerns     Thank you for choosing [facility] to provide your oncology and hematology care.  To afford each patient quality time with our provider, please arrive at least 15 minutes before your scheduled appointment time.   Beginning January 23rd 2017 lab work for the [facility] will be done in the  Main lab at [facility] on 1st floor. If you have a lab appointment with the [facility] please come in thru the  Main Entrance and check in at the main information desk  You need to re-schedule your appointment should you arrive 10 or more minutes late.  We strive to give you quality time with our providers, and arriving late affects you and other patients whose appointments are after yours.  Also, if you no show three or more times for appointments you may be dismissed from the clinic at the providers discretion.     Again, thank you for choosing [facility].  Our hope is that these requests will decrease the amount of time that you wait before being seen by our physicians.       _____________________________________________________________  Should you have questions after your visit to [facility], please contact our office at [phone] between the hours of 8:30 a.m. and 4:30 p.m.  Voicemails left after 4:30 p.m. will not be returned until the following business day.  For prescription  refill requests, have your pharmacy contact our office.

## 2015-04-15 LAB — CEA: CEA: 5.5 ng/mL — AB (ref 0.0–4.7)

## 2015-04-16 ENCOUNTER — Telehealth (HOSPITAL_COMMUNITY): Payer: Self-pay | Admitting: *Deleted

## 2015-04-19 ENCOUNTER — Telehealth (HOSPITAL_COMMUNITY): Payer: Self-pay | Admitting: *Deleted

## 2015-04-19 NOTE — Telephone Encounter (Signed)
Patient does not want to take her CT scans. She wants to come to [facility] twice a year and have a CEA level drawn instead. She stated that her CEA level going down was good for her. I encouraged her to have her CT scans once a year and she said no.

## 2015-05-26 ENCOUNTER — Telehealth (HOSPITAL_COMMUNITY): Payer: Self-pay

## 2015-05-26 NOTE — Telephone Encounter (Signed)
Spoke with patient. She has wondering if Tuck's pads would cause her to have infection if she used them? Told her that it was ok.

## 2015-05-26 NOTE — Telephone Encounter (Signed)
-----   Message from Epifanio Lesches sent at 05/26/2015 12:01 PM EST ----- Contact: [phone] Pt has a question about a possible infection

## 2015-10-14 ENCOUNTER — Other Ambulatory Visit (HOSPITAL_COMMUNITY): Payer: Medicare Other

## 2015-10-14 ENCOUNTER — Ambulatory Visit (HOSPITAL_COMMUNITY): Payer: Medicare Other | Admitting: Hematology & Oncology

## 2015-11-01 ENCOUNTER — Encounter (HOSPITAL_COMMUNITY): Payer: Medicare Other

## 2015-11-01 ENCOUNTER — Encounter (HOSPITAL_COMMUNITY): Payer: Self-pay | Admitting: Oncology

## 2015-11-01 ENCOUNTER — Encounter (HOSPITAL_COMMUNITY): Payer: Medicare Other | Attending: Oncology | Admitting: Oncology

## 2015-11-01 VITALS — BP 122/55 | HR 86 | Temp 98.1°F | Resp 16 | Wt 148.2 lb

## 2015-11-01 DIAGNOSIS — E876 Hypokalemia: Secondary | ICD-10-CM

## 2015-11-01 DIAGNOSIS — C182 Malignant neoplasm of ascending colon: Secondary | ICD-10-CM | POA: Insufficient documentation

## 2015-11-01 DIAGNOSIS — D500 Iron deficiency anemia secondary to blood loss (chronic): Secondary | ICD-10-CM

## 2015-11-01 LAB — CBC WITH DIFFERENTIAL/PLATELET
BASOS PCT: 0 %
Basophils Absolute: 0 10*3/uL (ref 0.0–0.1)
EOS ABS: 0.1 10*3/uL (ref 0.0–0.7)
EOS PCT: 1 %
HCT: 32.7 % — ABNORMAL LOW (ref 36.0–46.0)
HEMOGLOBIN: 11.3 g/dL — AB (ref 12.0–15.0)
Lymphocytes Relative: 32 %
Lymphs Abs: 1.9 10*3/uL (ref 0.7–4.0)
MCH: 30.5 pg (ref 26.0–34.0)
MCHC: 34.6 g/dL (ref 30.0–36.0)
MCV: 88.1 fL (ref 78.0–100.0)
MONOS PCT: 4 %
Monocytes Absolute: 0.2 10*3/uL (ref 0.1–1.0)
NEUTROS PCT: 63 %
Neutro Abs: 3.7 10*3/uL (ref 1.7–7.7)
PLATELETS: 268 10*3/uL (ref 150–400)
RBC: 3.71 MIL/uL — AB (ref 3.87–5.11)
RDW: 15.9 % — ABNORMAL HIGH (ref 11.5–15.5)
WBC: 5.9 10*3/uL (ref 4.0–10.5)

## 2015-11-01 LAB — COMPREHENSIVE METABOLIC PANEL
ALBUMIN: 3.9 g/dL (ref 3.5–5.0)
ALK PHOS: 58 U/L (ref 38–126)
ALT: 10 U/L — ABNORMAL LOW (ref 14–54)
ANION GAP: 7 (ref 5–15)
AST: 19 U/L (ref 15–41)
BUN: 32 mg/dL — ABNORMAL HIGH (ref 6–20)
CALCIUM: 9.9 mg/dL (ref 8.9–10.3)
CHLORIDE: 108 mmol/L (ref 101–111)
CO2: 24 mmol/L (ref 22–32)
Creatinine, Ser: 1.19 mg/dL — ABNORMAL HIGH (ref 0.44–1.00)
GFR calc non Af Amer: 41 mL/min — ABNORMAL LOW (ref >60)
GFR, EST AFRICAN AMERICAN: 48 mL/min — AB (ref >60)
GLUCOSE: 105 mg/dL — AB (ref 65–99)
Potassium: 3.1 mmol/L — ABNORMAL LOW (ref 3.5–5.1)
SODIUM: 139 mmol/L (ref 135–145)
Total Bilirubin: 0.9 mg/dL (ref 0.3–1.2)
Total Protein: 7 g/dL (ref 6.5–8.1)

## 2015-11-01 MED ORDER — POTASSIUM CHLORIDE CRYS ER 20 MEQ PO TBCR: 20.0000 meq | EXTENDED_RELEASE_TABLET | Freq: Two times a day (BID) | ORAL | 0 refills | Status: AC

## 2015-11-01 NOTE — Assessment & Plan Note (Addendum)
Stage II A (T3N0M0) colon cancer, S/P right hemicolectomy 09/13/2011 by Dr. Hassell Done. 16 nodes were negative no LV I was seen but she did have perineural space invasion found and a 4.5 cm tumor. This was a grade 1 tumor, but ulcerated. 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 There is no family history of colon cancer that she is aware of. She has no children.  She has been resistant to surveillance per NCCN guidelines.  Labs today: CBC diff, CMET, CEA.  I personally reviewed and went over laboratory results with the patient.  The results are noted within this dictation.  Hypokalemia is noted and this is likely secondary to HCTZ-containing antihypertensive.    Rx is printed for Kdur 40 mEq daily.  Future management will be deferred to primary care provider.  Labs in 6 months: CBC diff, CMET, CEA.  I personally reviewed and went over radiographic studies with the patient.  The results are noted within this dictation.  She is overdue for annual CT abd/pelvis screening for CRC in addition to annual mammogram (last one in 2014).   She refuses to allow me to set either of these tests up.  She knows to call if she changes her mind.  Weight is declining.  I think there is an element of dementia involved with this.   She has been resistant to recommended surveillance and preventative health maintenance; thereby hindering ability to appropriately follow NCCN guidelines.  Return in 6 months for follow-up.

## 2015-11-01 NOTE — Progress Notes (Signed)
Vicki Neighbors, MD [place] [address]  Malignant neoplasm of ascending colon ([facility]) - Plan: CBC with Differential, Comprehensive metabolic panel, CEA  Hypokalemia - Plan: potassium chloride SA (K-DUR,KLOR-CON) 20 MEQ tablet  CURRENT THERAPY: Surveillance per NCCN guidelines  INTERVAL HISTORY: Vicki Woods 82 y.o. female returns for followup of Stage II A (T3N0M0) colon cancer, S/P right hemicolectomy 09/13/2011 by Dr. Hassell Done. 16 nodes were negative no LV I was seen but she did have perineural space invasion found and a 4.5 cm tumor. This was a grade 1 tumor, but ulcerated. 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 There is no family history of colon cancer that she is aware of. She has no children.  She has been resistant to surveillance per NCCN guidelines.    Right hemicolectomy June 2013 for node negative adenocarcinoma   06/21/2011 Initial Diagnosis    Colonoscopy by Dr. Laural Golden- hepatic flexure polypectomy reveals adenocarcinoma arising from tubular adenoma.     06/22/2011 Imaging    CT- Minimal amount of fluid in [place]ison's pouch and small amount of fluid within the pelvis.  Etiology indeterminate.  Too small to characterize liver lesions possibly 2 small cysts although metastatic lesions not ruled out.       09/13/2011 Definitive Surgery    R ascending colon resection by Dr. Johnathan Hausen demonstraing an ulcerative, invasive, well-differentiated adenocarcinoma measuring 4.5 cm.  No LVI, but perineural invasion noted.  0/16 lymph nodes.  Clear margins.     09/12/2013 Survivorship    Colonoscopy by Dr. Laural Golden- tubular adenoma, not dysplasia or malignancy.     03/25/2014 Tumor Marker    CEA- 6.3     03/31/2014 Imaging    CT abd/pelvis- No evidence of recurrent or metastatic carcinoma within the abdomen or pelvis. No other acute findings identified. Stable benign bilateral adrenal gland hyperplasia versus adenomas.      She reports a bout of diarrhea that has resolved.  It lasted 24 hours.    She notes that her appetite is good.  She is eating breakfast daily and then an early dinner.    Her weight is noted to have declined.  I suspect she has dementia that is participating in this issue.  Otherwise, she denies any abdominal pain, nausea/vomiting, change in bowel habits, blood in stool.  Review of Systems  Constitutional: Positive for weight loss. Negative for chills and fever.  HENT: Negative.   Eyes: Negative.   Respiratory: Negative.  Negative for cough.   Cardiovascular: Negative.  Negative for chest pain.  Gastrointestinal: Negative.  Negative for abdominal pain, blood in stool, constipation, diarrhea, melena, nausea and vomiting.  Genitourinary: Negative.   Musculoskeletal: Negative.   Skin: Negative.   Neurological: Negative.  Negative for weakness.  Endo/Heme/Allergies: Negative.   Psychiatric/Behavioral: Negative.     Past Medical History:  Diagnosis Date  . Adenocarcinoma of colon ([facility]) 09/13/2011   Hepatic flexure  . Arthritis    Knees and shoulders  . CKD (chronic kidney disease) stage 3, GFR 30-59 ml/min   . Essential hypertension   . GERD (gastroesophageal reflux disease)   . Headache(784.0)   . Leg swelling   . Proteinuria     Past Surgical History:  Procedure Laterality Date  . CATARACT EXTRACTION W/PHACO Right 08/27/2012   Procedure: CATARACT EXTRACTION PHACO AND INTRAOCULAR LENS PLACEMENT (IOC);  Surgeon: Elta Guadeloupe T. Gershon Crane, MD;  Location: [facility];  Service: Ophthalmology;  Laterality:  Right;  CDE:9.80  . COLONOSCOPY  06/21/2011   Procedure: COLONOSCOPY;  Surgeon: Rogene Houston, MD;  Location: [facility];  Service: Endoscopy;  Laterality: N/A;  . COLONOSCOPY N/A 09/12/2013   Procedure: COLONOSCOPY;  Surgeon: Rogene Houston, MD;  Location: [facility];  Service: Endoscopy;  Laterality: N/A;  1240  . ESOPHAGOGASTRODUODENOSCOPY  06/20/2011   Procedure:  ESOPHAGOGASTRODUODENOSCOPY (EGD);  Surgeon: Rogene Houston, MD;  Location: [facility];  Service: Endoscopy;  Laterality: N/A;  . HEMICOLECTOMY  09/13/2011  . TOTAL ABDOMINAL HYSTERECTOMY W/ BILATERAL SALPINGOOPHORECTOMY      Family History  Problem Relation Age of Onset  . Ovarian cancer Mother   . Lung cancer Brother     Social History   Social History  . Marital status: Widowed    Spouse name: N/A  . Number of children: N/A  . Years of education: N/A   Social History Main Topics  . Smoking status: Never Smoker  . Smokeless tobacco: Never Used  . Alcohol use No  . Drug use: No  . Sexual activity: Not Asked   Other Topics Concern  . None   Social History Narrative   No regular exercise     PHYSICAL EXAMINATION  ECOG PERFORMANCE STATUS: 1 - Symptomatic but completely ambulatory  Vitals:   11/01/15 0903  BP: (!) 122/55  Pulse: 86  Resp: 16  Temp: 98.1 F (36.7 C)    GENERAL:alert, no distress, comfortable, cooperative, smiling and unaccompanied today SKIN: skin color, texture, turgor are normal, no rashes or significant lesions HEAD: Normocephalic, No masses, lesions, tenderness or abnormalities EYES: normal, EOMI, Conjunctiva are pink and non-injected EARS: External ears normal OROPHARYNX:lips, buccal mucosa, and tongue normal and mucous membranes are moist  NECK: supple, trachea midline LYMPH:  no palpable lymphadenopathy BREAST:not examined LUNGS: clear to auscultation and percussion HEART: regular rate & rhythm, no murmurs and no gallops ABDOMEN:abdomen soft, and normal bowel sounds BACK: Back symmetric, no curvature. EXTREMITIES:less then 2 second capillary refill, no joint deformities, effusion, or inflammation, no skin discoloration, no cyanosis, positive findings:  edema B/L and equal lower extremity edema.  NEURO: alert & oriented x 3 with fluent speech, no focal motor/sensory deficits, gait normal with walking cane.   LABORATORY DATA: CBC      Component Value Date/Time   WBC 5.9 11/01/2015 0839   RBC 3.71 (L) 11/01/2015 0839   HGB 11.3 (L) 11/01/2015 0839   HCT 32.7 (L) 11/01/2015 0839   PLT 268 11/01/2015 0839   MCV 88.1 11/01/2015 0839   MCH 30.5 11/01/2015 0839   MCHC 34.6 11/01/2015 0839   RDW 15.9 (H) 11/01/2015 0839   LYMPHSABS 1.9 11/01/2015 0839   MONOABS 0.2 11/01/2015 0839   EOSABS 0.1 11/01/2015 0839   BASOSABS 0.0 11/01/2015 0839      Chemistry      Component Value Date/Time   NA 139 11/01/2015 0839   K 3.1 (L) 11/01/2015 0839   CL 108 11/01/2015 0839   CO2 24 11/01/2015 0839   BUN 32 (H) 11/01/2015 0839   CREATININE 1.19 (H) 11/01/2015 0839      Component Value Date/Time   CALCIUM 9.9 11/01/2015 0839   ALKPHOS 58 11/01/2015 0839   AST 19 11/01/2015 0839   ALT 10 (L) 11/01/2015 0839   BILITOT 0.9 11/01/2015 0839        PENDING LABS:   RADIOGRAPHIC STUDIES:  No results found.   PATHOLOGY:    ASSESSMENT AND PLAN:  Right  hemicolectomy June 2013 for node negative adenocarcinoma Stage II A (T3N0M0) colon cancer, S/P right hemicolectomy 09/13/2011 by Dr. Hassell Done. 16 nodes were negative no LV I was seen but she did have perineural space invasion found and a 4.5 cm tumor. This was a grade 1 tumor, but ulcerated. 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 There is no family history of colon cancer that she is aware of. She has no children.  She has been resistant to surveillance per NCCN guidelines.  Labs today: CBC diff, CMET, CEA.  I personally reviewed and went over laboratory results with the patient.  The results are noted within this dictation.  Hypokalemia is noted and this is likely secondary to HCTZ-containing antihypertensive.    Rx is printed for Kdur 40 mEq daily.  Future management will be deferred to primary care provider.  Labs in 6 months: CBC diff, CMET, CEA.  I personally reviewed and went over radiographic studies with the patient.  The results  are noted within this dictation.  She is overdue for annual CT abd/pelvis screening for CRC in addition to annual mammogram (last one in 2014).   She refuses to allow me to set either of these tests up.  She knows to call if she changes her mind.  Weight is declining.  I think there is an element of dementia involved with this.   She has been resistant to recommended surveillance and preventative health maintenance; thereby hindering ability to appropriately follow NCCN guidelines.  Return in 6 months for follow-up.     ORDERS PLACED FOR THIS ENCOUNTER: Orders Placed This Encounter  Procedures  . CBC with Differential  . Comprehensive metabolic panel  . CEA    MEDICATIONS PRESCRIBED THIS ENCOUNTER: Meds ordered this encounter  Medications  . potassium chloride SA (K-DUR,KLOR-CON) 20 MEQ tablet    Sig: Take 1 tablet (20 mEq total) by mouth 2 (two) times daily.    Dispense:  60 tablet    Refill:  0    Order Specific Question:   Supervising Provider    Answer:   Patrici Ranks [id number]    THERAPY PLAN:  NCCN guidelines for surveillance for Colon cancer are as follows (1.2017):  A. Stage I   1. Colonoscopy at year 1    A. If advanced adenoma, repeat in 1 year    B. If no advanced adenoma, repeat in 3 years, and then every 5 years.  B. Stage II, Stage III   1. H+P every 3-6 months x 2 years and then every 6 months for a total of 5 years    2. CEA every 3-6 months x 2 years and then every 6 months for a total of 5 years    3. CT CAP every 6-12 months (category 2B for frequency < 12 months) for a total of 5 years .   4.  Colonoscopy in 1 year except if no preoperative colonoscopy due to obstructing lesion, colonoscopy in 3-6 months.     A. If advanced adenoma, repeat in 1 year    B. If no advanced adenoma, repeat in 3 years, then every 5 years   5. PET/CT scan is not recommended.  C. Stage IV   1. H+P every 3-6 months x 2 years and then every 6 months for a total of 5 years      2. CEA every 3 months x 2 years and then every 6 months for a total of 3- 5 years  3. CT CAP every 3-6 months (category 2B for frequency < 6 months) x 2 years., then every 6-12 months for a total of 5 years .   4. Colonoscopy in 1 year except if no preoperative colonoscopy due to obstructing lesion, colonoscopy in 3-6 months.     A. If advanced adenoma, repeat in 1 year    B. If no advanced adenoma, repeat in 3 years, then every 5 years   All questions were answered. The patient knows to call the clinic with any problems, questions or concerns. We can certainly see the patient much sooner if necessary.  Patient and plan discussed with Dr. Ancil Linsey and she is in agreement with the aforementioned.   This note is electronically signed by: Doy Mince 11/01/2015 9:29 AM

## 2015-11-01 NOTE — Patient Instructions (Addendum)
[place] at [facility] Discharge Instructions  RECOMMENDATIONS MADE BY THE CONSULTANT AND ANY TEST RESULTS WILL BE SENT TO YOUR REFERRING PHYSICIAN.  You saw Kirby Crigler, PA-C, today. Start taking potassium pill 2 times a day. Return in 6 months for labs and follow up visit.  Thank you for choosing [place] at [facility] to provide your oncology and hematology care.  To afford each patient quality time with our provider, please arrive at least 15 minutes before your scheduled appointment time.   Beginning January 23rd 2017 lab work for the [facility] will be done in the  Main lab at [facility] on 1st floor. If you have a lab appointment with the [facility] please come in thru the  Main Entrance and check in at the main information desk  You need to re-schedule your appointment should you arrive 10 or more minutes late.  We strive to give you quality time with our providers, and arriving late affects you and other patients whose appointments are after yours.  Also, if you no show three or more times for appointments you may be dismissed from the clinic at the providers discretion.     Again, thank you for choosing [facility].  Our hope is that these requests will decrease the amount of time that you wait before being seen by our physicians.       _____________________________________________________________  Should you have questions after your visit to [facility], please contact our office at [phone] between the hours of 8:30 a.m. and 4:30 p.m.  Voicemails left after 4:30 p.m. will not be returned until the following business day.  For prescription refill requests, have your pharmacy contact our office.         Resources For Cancer Patients and their Caregivers ? American Cancer Society: Can assist with transportation, wigs, general needs, runs Look Good Feel Better.         8013890540 ? Cancer Care: Provides financial assistance, online support groups, medication/co-pay assistance.  1-800-813-HOPE 817-629-9263) ? [facility] Assists [place] cancer patients and their families through emotional , educational and financial support.  (856) 843-0733 ? [place] DSS Where to apply for food stamps, Medicaid and utility assistance. 302-077-7398 ? [facility]: Transportation to medical appointments. 838-337-6562 ? Social Security Administration: May apply for disability if have a Stage IV cancer. 832-022-8285 367-625-1299 ? [employer], Disability and Transit Services: Assists with nutrition, care and transit needs. [facility] Support Programs: @10RELATIVEDAYS @ > Cancer Support Group  2nd Tuesday of the month 1pm-2pm, Journey Room  > Creative Journey  3rd Tuesday of the month 1130am-1pm, Journey Room  > Look Good Feel Better  1st Wednesday of the month 10am-12 noon, Journey Room (Call [facility] to register [phone])

## 2015-11-02 LAB — CEA: CEA: 5.9 ng/mL — AB (ref 0.0–4.7)

## 2015-11-11 ENCOUNTER — Telehealth (HOSPITAL_COMMUNITY): Payer: Self-pay | Admitting: *Deleted

## 2015-11-11 NOTE — Telephone Encounter (Signed)
Pt states that Tom had her on potassium two times a day. Pt states that she has stuffy nose,dry mouth. Pt wants to know if she can take one instead of two.   I spoke with Kirby Crigler PA-C and he advised the pt needs to continue taking the 2 tablets of potassium.   Pt advised of above and verbalized understanding.

## 2016-02-14 ENCOUNTER — Encounter (HOSPITAL_COMMUNITY): Payer: Self-pay | Admitting: Emergency Medicine

## 2016-02-14 ENCOUNTER — Emergency Department (HOSPITAL_COMMUNITY): Payer: Medicare Other

## 2016-02-14 ENCOUNTER — Inpatient Hospital Stay (HOSPITAL_COMMUNITY)
Admission: EM | Admit: 2016-02-14 | Discharge: 2016-02-18 | DRG: 064 | Disposition: E | Payer: Medicare Other | Attending: Internal Medicine | Admitting: Internal Medicine

## 2016-02-14 DIAGNOSIS — I629 Nontraumatic intracranial hemorrhage, unspecified: Secondary | ICD-10-CM | POA: Diagnosis not present

## 2016-02-14 DIAGNOSIS — N183 Chronic kidney disease, stage 3 (moderate): Secondary | ICD-10-CM | POA: Diagnosis present

## 2016-02-14 DIAGNOSIS — R4189 Other symptoms and signs involving cognitive functions and awareness: Secondary | ICD-10-CM

## 2016-02-14 DIAGNOSIS — Z801 Family history of malignant neoplasm of trachea, bronchus and lung: Secondary | ICD-10-CM | POA: Diagnosis not present

## 2016-02-14 DIAGNOSIS — I63519 Cerebral infarction due to unspecified occlusion or stenosis of unspecified middle cerebral artery: Principal | ICD-10-CM | POA: Diagnosis present

## 2016-02-14 DIAGNOSIS — Z91018 Allergy to other foods: Secondary | ICD-10-CM | POA: Diagnosis not present

## 2016-02-14 DIAGNOSIS — K219 Gastro-esophageal reflux disease without esophagitis: Secondary | ICD-10-CM | POA: Diagnosis present

## 2016-02-14 DIAGNOSIS — R001 Bradycardia, unspecified: Secondary | ICD-10-CM | POA: Diagnosis present

## 2016-02-14 DIAGNOSIS — I619 Nontraumatic intracerebral hemorrhage, unspecified: Secondary | ICD-10-CM | POA: Diagnosis present

## 2016-02-14 DIAGNOSIS — Z85038 Personal history of other malignant neoplasm of large intestine: Secondary | ICD-10-CM

## 2016-02-14 DIAGNOSIS — G936 Cerebral edema: Secondary | ICD-10-CM | POA: Diagnosis present

## 2016-02-14 DIAGNOSIS — Z880 Allergy status to penicillin: Secondary | ICD-10-CM

## 2016-02-14 DIAGNOSIS — Z885 Allergy status to narcotic agent status: Secondary | ICD-10-CM | POA: Diagnosis not present

## 2016-02-14 DIAGNOSIS — Z66 Do not resuscitate: Secondary | ICD-10-CM | POA: Diagnosis present

## 2016-02-14 DIAGNOSIS — I639 Cerebral infarction, unspecified: Secondary | ICD-10-CM

## 2016-02-14 DIAGNOSIS — Z79899 Other long term (current) drug therapy: Secondary | ICD-10-CM

## 2016-02-14 DIAGNOSIS — Z886 Allergy status to analgesic agent status: Secondary | ICD-10-CM

## 2016-02-14 DIAGNOSIS — Z8041 Family history of malignant neoplasm of ovary: Secondary | ICD-10-CM | POA: Diagnosis not present

## 2016-02-14 DIAGNOSIS — Z515 Encounter for palliative care: Secondary | ICD-10-CM | POA: Diagnosis present

## 2016-02-14 DIAGNOSIS — I129 Hypertensive chronic kidney disease with stage 1 through stage 4 chronic kidney disease, or unspecified chronic kidney disease: Secondary | ICD-10-CM | POA: Diagnosis present

## 2016-02-14 DIAGNOSIS — Z882 Allergy status to sulfonamides status: Secondary | ICD-10-CM

## 2016-02-14 DIAGNOSIS — G935 Compression of brain: Secondary | ICD-10-CM | POA: Diagnosis present

## 2016-02-14 DIAGNOSIS — R4182 Altered mental status, unspecified: Secondary | ICD-10-CM | POA: Diagnosis present

## 2016-02-14 LAB — URINE MICROSCOPIC-ADD ON

## 2016-02-14 LAB — COMPREHENSIVE METABOLIC PANEL
ALBUMIN: 3.5 g/dL (ref 3.5–5.0)
ALK PHOS: 52 U/L (ref 38–126)
ALT: 11 U/L — ABNORMAL LOW (ref 14–54)
AST: 28 U/L (ref 15–41)
Anion gap: 7 (ref 5–15)
BILIRUBIN TOTAL: 0.7 mg/dL (ref 0.3–1.2)
BUN: 33 mg/dL — AB (ref 6–20)
CO2: 23 mmol/L (ref 22–32)
Calcium: 9.6 mg/dL (ref 8.9–10.3)
Chloride: 109 mmol/L (ref 101–111)
Creatinine, Ser: 1.21 mg/dL — ABNORMAL HIGH (ref 0.44–1.00)
GFR calc Af Amer: 47 mL/min — ABNORMAL LOW (ref >60)
GFR calc non Af Amer: 41 mL/min — ABNORMAL LOW (ref >60)
GLUCOSE: 186 mg/dL — AB (ref 65–99)
POTASSIUM: 3.3 mmol/L — AB (ref 3.5–5.1)
SODIUM: 139 mmol/L (ref 135–145)
TOTAL PROTEIN: 6.2 g/dL — AB (ref 6.5–8.1)

## 2016-02-14 LAB — URINALYSIS, ROUTINE W REFLEX MICROSCOPIC
BILIRUBIN URINE: NEGATIVE
GLUCOSE, UA: NEGATIVE mg/dL
LEUKOCYTES UA: NEGATIVE
Nitrite: NEGATIVE
PH: 7 (ref 5.0–8.0)
PROTEIN: 100 mg/dL — AB
Specific Gravity, Urine: 1.015 (ref 1.005–1.030)

## 2016-02-14 LAB — I-STAT CG4 LACTIC ACID, ED: Lactic Acid, Venous: 1.69 mmol/L (ref 0.5–1.9)

## 2016-02-14 LAB — I-STAT CHEM 8, ED
BUN: 31 mg/dL — AB (ref 6–20)
CREATININE: 1.4 mg/dL — AB (ref 0.44–1.00)
Calcium, Ion: 1.34 mmol/L (ref 1.15–1.40)
Chloride: 108 mmol/L (ref 101–111)
Glucose, Bld: 181 mg/dL — ABNORMAL HIGH (ref 65–99)
HEMATOCRIT: 26 % — AB (ref 36.0–46.0)
Hemoglobin: 8.8 g/dL — ABNORMAL LOW (ref 12.0–15.0)
POTASSIUM: 3.3 mmol/L — AB (ref 3.5–5.1)
Sodium: 142 mmol/L (ref 135–145)
TCO2: 22 mmol/L (ref 0–100)

## 2016-02-14 LAB — RAPID URINE DRUG SCREEN, HOSP PERFORMED
AMPHETAMINES: NOT DETECTED
BARBITURATES: NOT DETECTED
Benzodiazepines: NOT DETECTED
COCAINE: NOT DETECTED
Opiates: NOT DETECTED
TETRAHYDROCANNABINOL: NOT DETECTED

## 2016-02-14 LAB — CBC
HCT: 29.2 % — ABNORMAL LOW (ref 36.0–46.0)
HEMOGLOBIN: 9.7 g/dL — AB (ref 12.0–15.0)
MCH: 29.8 pg (ref 26.0–34.0)
MCHC: 33.2 g/dL (ref 30.0–36.0)
MCV: 89.6 fL (ref 78.0–100.0)
PLATELETS: 237 10*3/uL (ref 150–400)
RBC: 3.26 MIL/uL — AB (ref 3.87–5.11)
RDW: 16.4 % — ABNORMAL HIGH (ref 11.5–15.5)
WBC: 6.3 10*3/uL (ref 4.0–10.5)

## 2016-02-14 LAB — PROTIME-INR
INR: 0.98
PROTHROMBIN TIME: 13 s (ref 11.4–15.2)

## 2016-02-14 LAB — DIFFERENTIAL
BASOS ABS: 0 10*3/uL (ref 0.0–0.1)
Basophils Relative: 0 %
EOS ABS: 0.1 10*3/uL (ref 0.0–0.7)
Eosinophils Relative: 1 %
LYMPHS ABS: 2.9 10*3/uL (ref 0.7–4.0)
LYMPHS PCT: 46 %
Monocytes Absolute: 0.2 10*3/uL (ref 0.1–1.0)
Monocytes Relative: 4 %
NEUTROS PCT: 49 %
Neutro Abs: 3.1 10*3/uL (ref 1.7–7.7)

## 2016-02-14 LAB — APTT: APTT: 26 s (ref 24–36)

## 2016-02-14 LAB — ETHANOL: Alcohol, Ethyl (B): <5 mg/dL (ref <5)

## 2016-02-14 LAB — I-STAT TROPONIN, ED: Troponin i, poc: 0.05 ng/mL (ref 0.00–0.08)

## 2016-02-14 MED ORDER — NICARDIPINE HCL IN NACL 20-0.86 MG/200ML-% IV SOLN: 5.0000 mg/h | Freq: Once | INTRAVENOUS | Status: DC

## 2016-02-14 MED ORDER — FENTANYL CITRATE (PF) 100 MCG/2ML IJ SOLN: 50.0000 ug | INTRAMUSCULAR | Status: DC | PRN

## 2016-02-14 MED ORDER — ETOMIDATE 2 MG/ML IV SOLN
INTRAVENOUS | Status: AC | PRN
  Administered 2016-02-14: 10 mg via INTRAVENOUS

## 2016-02-14 MED ORDER — SODIUM CHLORIDE 0.9 % IV SOLN
40.0000 ug/h | INTRAVENOUS | Status: DC
  Administered 2016-02-14: 40 ug/h via INTRAVENOUS
  Filled 2016-02-14: qty 50

## 2016-02-14 MED ORDER — SODIUM CHLORIDE 0.9 % IV SOLN
Freq: Once | INTRAVENOUS | Status: AC
  Administered 2016-02-14: 12:00:00 via INTRAVENOUS

## 2016-02-14 MED ORDER — ROCURONIUM BROMIDE 50 MG/5ML IV SOLN
INTRAVENOUS | Status: AC | PRN
Start: 2016-02-14 — End: 2016-02-14
  Administered 2016-02-14: 70 mg via INTRAVENOUS

## 2016-02-15 MED FILL — Medication: Qty: 1 | Status: AC

## 2016-02-18 NOTE — Progress Notes (Signed)
Terminally extubated patient at this time per MD order. Placed patient on 2lpm [place] for comfort measures only. Patient tolerated procedure well. No breathing effort noted after extubation.

## 2016-02-18 NOTE — ED Notes (Signed)
Called code stroke @ 1101. Specialist on call spoke with @ 1102. Cart placed in room @ 1106.

## 2016-02-18 NOTE — Progress Notes (Signed)
BEEPER [place] TO PATIENT BEING INTUBATED

## 2016-02-18 NOTE — ED Triage Notes (Signed)
Patient brought in by EMS. States patient called [facility] at 1000 with complaint of vision problems. [facility] then called EMS who had to break in apartment due to no answer from patient. Found patient unresponsive at 1030.

## 2016-02-18 NOTE — ED Notes (Signed)
Dr Zavitz at bedside  

## 2016-02-18 NOTE — Discharge Summary (Signed)
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 At the time of my examination, patient is on mechanical ventilation, intubated. History is based on EDP report. Per EDP, Dr Reather Converse, patient had initially called her PCP earlier this morning at 10 AM reporting visual changes. Apparently, patient was also sounding "nervous". Patient's PCP sent EMS to the patient's home however they had to break into her apartment as there was no response from the patient. EMS found the patient unresponsive at 10:30 AM. Patient was brought to the ED and patient was intubated in ED for airway protection. Stat CT head with stroke protocol was obtained which showed extensive acute parenchymal hemorrhage in the high right cerebral hemisphere with subfalcine and uncal herniation. Given the patchy hemorrhage and prominent edema, suspect hemorrhagic transformation of the MCA infarct. Initially patient was started on Cardene drip. Neurosurgery and critical care was paged at [facility]. Dr. Zachery Dauer spoke with critical care, neurology and neurosurgery neurosurgery at [facility] and patient was deemed non-survivable with CT head findings. Dr. Zachery Dauer subsequently spoke with the patient's only family member, her sister from [place], who said she would not want any further aggressive care with this diagnosis and recommended weaning off the ventilator and pursue comfort care  at this time.  Patient was placed on comfort care orders with morphine, Ativan as needed. The patient was terminally extubated, placed on 2 L nasal cannula for comfort measures. Patient had no breathing effort after the extubation. She expired at 4:26 PM on the day of admission.    SignedEstill Cotta M.D. [facility] 03-14-16, 4:48 PM Pager: [phone]

## 2016-02-18 NOTE — H&P (Signed)
History and Physical        Hospital Admission Note Date: 02-20-2016  Patient name: Vicki Woods Medical record number: [id number] Date of birth: 1931/09/27 Age: 82 y.o. Gender: female  PCP: Wende Neighbors, MD   Referring physician: Dr Reather Converse  Patient coming from: home    Chief Complaint:  Unresponsive  HPI: Patient is a 82 year old female with history of colon cancer, hypertension, CKD stage III was brought to ED unresponsive. At the time of my examination, patient is on mechanical ventilation, intubated. History is based on EDP report. Per EDP, Dr Reather Converse, patient had initially called her PCP earlier this morning at 10 AM reporting visual changes. Apparently, patient was also sounding "nervous". Patient's PCP [facility] to the patient's home however they had to break in her apartment as there was no response from the patient. EMS found the patient unresponsive at 10:30 AM. Patient was brought to the ED and patient was intubated in ED for airway protection. Stat CT head with stroke protocol was obtained which showed extensive acute parenchymal hemorrhage in the high right cerebral hemisphere with subfalcine and uncal herniation. Given the patchy hemorrhage and prominent edema, suspect hemorrhagic transformation of the MCA infarct. ED work-up/course:  Initially patient was started on Cardene drip. Neurosurgery and critical care was paged at [facility]. Dr. Zachery Dauer spoke with critical care, neurology and neurosurgery neurosurgery at [facility] and patient was deemed non-survivable with CT head findings. Dr. Zachery Dauer subsequently spoke with the patient's only family member, her sister from [place], who said she would not want any further aggressive care with this diagnosis and recommended weaning off the ventilator and pursue comfort care at this time.   Review of Systems:  Positives marked in 'bold' Unable to obtain any history from the patient, unresponsive  Past Medical History: Past Medical History:  Diagnosis Date  . Adenocarcinoma of colon ([place]) 09/13/2011   Hepatic flexure  . Arthritis    Knees and shoulders  . CKD (chronic kidney disease) stage 3, GFR 30-59 ml/min   . Essential hypertension   . GERD (gastroesophageal reflux disease)   . Headache(784.0)   . Leg swelling   . Proteinuria     Past Surgical History:  Procedure Laterality Date  . CATARACT EXTRACTION W/PHACO Right 08/27/2012   Procedure: CATARACT EXTRACTION PHACO AND INTRAOCULAR LENS PLACEMENT (IOC);  Surgeon: Elta Guadeloupe T. Gershon Crane, MD;  Location: [facility] OR[facility];  Service: Ophthalmology;  Laterality: Right;  CDE:9.80  . COLONOSCOPY  06/21/2011   Procedure: COLONOSCOPY;  Surgeon: Rogene Houston, MD;  Location: [facility] END[facility] SU[facility];  Service: Endoscopy;  Laterality: N/A;  . COLONOSCOPY N/A 09/12/2013   Procedure: COLONOSCOPY;  Surgeon: Rogene Houston, MD;  Location: [facility];  Service: Endoscopy;  Laterality: N/A;  1240  . ESOPHAGOGASTRODUODENOSCOPY  06/20/2011   Procedure: ESOPHAGOGASTRODUODENOSCOPY (EGD);  Surgeon: Rogene Houston, MD;  Location: [facility];  Service: Endoscopy;  Laterality: N/A;  . HEMICOLECTOMY  09/13/2011  . TOTAL ABDOMINAL HYSTERECTOMY W/ BILATERAL SALPINGOOPHORECTOMY      Medications: Prior to Admission medications   Medication Sig Start Date End Date Taking? Authorizing Provider  acetaminophen (TYLENOL) 500 MG tablet Take 500 mg  by mouth every 4 (four) hours as needed. For pain    Historical Provider, MD  cholecalciferol (VITAMIN D) 1000 UNITS tablet Take 1,000 Units by mouth daily.    Historical Provider, MD  losartan-hydrochlorothiazide (HYZAAR) 100-25 MG per tablet Take 1 tablet by mouth daily. Patient takes around 1100am or 1200noon    Historical Provider, MD  polyvinyl alcohol (LIQUID TEARS) 1.4 % ophthalmic solution Place 2 drops into both eyes daily as needed. For dry  eyes    Historical Provider, MD  potassium chloride SA (K-DUR,KLOR-CON) 20 MEQ tablet Take 1 tablet (20 mEq total) by mouth 2 (two) times daily. 11/01/15   Baird Cancer, PA-C    Allergies:   Allergies  Allergen Reactions  . Chocolate Shortness Of Breath and Swelling  . Honey Shortness Of Breath  . Onion Shortness Of Breath  . Penicillins Anaphylaxis  . Sulfonamide Derivatives Anaphylaxis  . Aspirin     Former doctor told patient to stop due to allergy shots she was receiving.  . Codeine Anxiety    Social History:  Per chart review reports that she has never smoked. She has never used smokeless tobacco. She reports that she does not drink alcohol or use drugs.  Family History: Family History  Problem Relation Age of Onset  . Ovarian cancer Mother   . Lung cancer Brother     Physical Exam: Blood pressure 151/66, pulse 71, temperature (!) 94.1 F (34.5 C), resp. rate 16, weight 68 kg (150 lb), SpO2 100 %. General: Intubated, mechanically ventilated  HEENT: normocephalic, atraumatic, anicteric sclera, pink conjunctiva, pupils  dilated, sluggish, oropharynx clear Neck: supple, no masses or lymphadenopathy, no goiter, no bruits  Heart: Regular rate and rhythm, without murmurs, rubs or gallops. Lungs: Clear to auscultation bilaterally, no wheezing, rales or rhonchi. Abdomen: Soft, nontender, nondistended, positive bowel sounds, no masses. Extremities: No clubbing, cyanosis or edema with positive pedal pulses. Neuro: sedated, unable to assess  Psych: Sedated, unable to assess skin: no rashes or lesions, warm and dry   LABS on Admission:  Basic Metabolic Panel:  Recent Labs Lab 2016-02-15 1058 02/15/2016 1122  NA 139 142  K 3.3* 3.3*  CL 109 108  CO2 23  --   GLUCOSE 186* 181*  BUN 33* 31*  CREATININE 1.21* 1.40*  CALCIUM 9.6  --    Liver Function Tests:  Recent Labs Lab February 15, 2016 1058  AST 28  ALT 11*  ALKPHOS 52  BILITOT 0.7  PROT 6.2*  ALBUMIN 3.5   No  results for input(s): LIPASE, AMYLASE in the last 168 hours. No results for input(s): AMMONIA in the last 168 hours. CBC:  Recent Labs Lab 02-15-2016 1058 02/15/2016 1122  WBC 6.3  --   NEUTROABS 3.1  --   HGB 9.7* 8.8*  HCT 29.2* 26.0*  MCV 89.6  --   PLT 237  --    Cardiac Enzymes: No results for input(s): CKTOTAL, CKMB, CKMBINDEX, TROPONINI in the last 168 hours. BNP: Invalid input(s): POCBNP CBG: No results for input(s): GLUCAP in the last 168 hours.  Radiological Exams on Admission:  No results found.  *I have personally reviewed the images above*     Assessment/Plan Active Problems: MCA infarct with hemorrhagic transformation, Intracranial hemorrhage (HCC), nonsurvivable -  CT head with stroke protocol was obtained which showed extensive acute parenchymal hemorrhage in the high right cerebral hemisphere with subfalcine and uncal herniation. Given the patchy hemorrhage and prominent edema, suspect hemorrhagic transformation of the MCA infarct. -  EDP, Dr Reather Converse has spoken with neurology and neurosurgery at [facility] and patient was deemed nonsurvivable. He has also spoken with patient's sister who has requested comfort care at this point - Palliative consult placed, terminal weaning off ventilator - Placed on morphine IV as needed, Ativan as needed  - Further palliative focused orders by palliative medicine   CODE STATUS: DO NOT RESUSCITATE, DO NOT INTUBATE, comfort care   Consults called: Dr Reather Converse Had spoken with neurology, neurosurgery and critical care at [facility] Communication: No family member at the bedside   Admission status: inpatient, Hospital death expected   Disposition plan:  At the time of admission, it appears that the appropriate admission status for this patient is INPATIENT . This is judged to be reasonable and necessary in order to provide the required intensity of service to ensure the patient's safety given the presenting symptoms,  physical exam findings, and initial radiographic and laboratory data in the context of their chronic comorbidities.     Time Spent on Admission: 22mins   Azlin Zilberman M.D. [facility] 03/03/2016, 2:21 PM Pager: [phone]  If 7PM-7AM, please contact night-coverage [email or web address] Password [email or web address]

## 2016-02-18 NOTE — Progress Notes (Signed)
Patient came in via EMS unresponsive. MD intubated with a 7.0 ETT at 22 at the lip without any complication. BBS noted, chest rise noted, as well as positive color change on CO2 detector.  While taping airway and moving the ETT slid to 24cm at the lip. Patient was then transported to CT on the vent with no complications. After Xray was read, MD asked to pull back 2cm. The ETT was then pulled back to 22cm once again. The patient is not resisting vent at this time. RT will continue to monitor patient throughout day.

## 2016-02-18 NOTE — ED Notes (Signed)
Dr Reather Converse spoke with  Vicki Woods ( only living relative) [phone] about condition and the fact that she will not survive this event.

## 2016-02-18 NOTE — ED Provider Notes (Signed)
Vicki Woods Provider Note   CSN: [id number] Arrival date & time: 03/12/2016  1057  By signing my name below, I, Vicki Woods, attest that this documentation has been prepared under the direction and in the presence of Vicki Morrison, MD . Electronically Signed: Higinio Woods, Scribe. 2016/03/12. 11:11 AM.  History   Chief Complaint Chief Complaint  Patient presents with  . Code Stroke   The history is provided by the EMS personnel. No language interpreter was used.   LEVEL V CAVEAT and Limited ROS due to Pt Unresponsive.   HPI Comments: Vicki Woods is a 82 y.o. female with PMHx of CKD, HTN and colon cancer, who presents to the Emergency Department for AMS. Per EMS, pt initially called her PCP, [place], at 10:00 AM who called EMS for "nervousness." EMS notes pt called [facility] back for a separate complaint of "visual changes." EMS reports they arrived at pt's home and had to "break through the window to access pt" due to no answer from pt. Pt was found "crumpled down on the floor next to her phone which was off the hook."   Code Stroke was called upon assessment.   Past Medical History:  Diagnosis Date  . Adenocarcinoma of colon ([place]) 09/13/2011   Hepatic flexure  . Arthritis    Knees and shoulders  . CKD (chronic kidney disease) stage 3, GFR 30-59 ml/min   . Essential hypertension   . GERD (gastroesophageal reflux disease)   . Headache(784.0)   . Leg swelling   . Proteinuria     Patient Active Problem List   Diagnosis Date Noted  . Intracranial hemorrhage ([place]) 03-12-2016  . Hypertension, essential 10/31/2014  . Renal insufficiency 10/31/2014  . Dependent edema 10/31/2014  . Right hemicolectomy June 2013 for node negative adenocarcinoma 08/04/2011  . GI bleed 06/19/2011  . Anemia 06/19/2011  . Obesity 06/19/2011  . HYPERTENSION 12/22/2009  . CARDIAC MURMUR 12/22/2009  . HEARTBURN 12/22/2009    Past Surgical History:  Procedure Laterality Date  . CATARACT  EXTRACTION W/PHACO Right 08/27/2012   Procedure: CATARACT EXTRACTION PHACO AND INTRAOCULAR LENS PLACEMENT (IOC);  Surgeon: Elta Guadeloupe T. Gershon Crane, MD;  Location: [facility] ORS;  Service: Ophthalmology;  Laterality: Right;  CDE:9.80  . COLONOSCOPY  06/21/2011   Procedure: COLONOSCOPY;  Surgeon: Rogene Houston, MD;  Location: [facility];  Service: Endoscopy;  Laterality: N/A;  . COLONOSCOPY N/A 09/12/2013   Procedure: COLONOSCOPY;  Surgeon: Rogene Houston, MD;  Location: [facility];  Service: Endoscopy;  Laterality: N/A;  1240  . ESOPHAGOGASTRODUODENOSCOPY  06/20/2011   Procedure: ESOPHAGOGASTRODUODENOSCOPY (EGD);  Surgeon: Rogene Houston, MD;  Location: [facility];  Service: Endoscopy;  Laterality: N/A;  . HEMICOLECTOMY  09/13/2011  . TOTAL ABDOMINAL HYSTERECTOMY W/ BILATERAL SALPINGOOPHORECTOMY      OB History    No data available       Home Medications    Prior to Admission medications   Medication Sig Start Date End Date Taking? Authorizing Provider  acetaminophen (TYLENOL) 500 MG tablet Take 500 mg by mouth every 4 (four) hours as needed. For pain    Historical Provider, MD  cholecalciferol (VITAMIN D) 1000 UNITS tablet Take 1,000 Units by mouth daily.    Historical Provider, MD  losartan-hydrochlorothiazide (HYZAAR) 100-25 MG per tablet Take 1 tablet by mouth daily. Patient takes around 1100am or 1200noon    Historical Provider, MD  polyvinyl alcohol (LIQUID TEARS) 1.4 % ophthalmic solution Place 2 drops into both eyes daily as  needed. For dry eyes    Historical Provider, MD  potassium chloride SA (K-DUR,KLOR-CON) 20 MEQ tablet Take 1 tablet (20 mEq total) by mouth 2 (two) times daily. 11/01/15   Baird Cancer, PA-C    Family History Family History  Problem Relation Age of Onset  . Ovarian cancer Mother   . Lung cancer Brother     Social History Social History  Substance Use Topics  . Smoking status: Never Smoker  . Smokeless tobacco: Never Used  . Alcohol use No      Allergies   Chocolate; Honey; Onion; Penicillins; Sulfonamide derivatives; Aspirin; and Codeine   Review of Systems Review of Systems  Unable to perform ROS: Acuity of condition   Physical Exam Updated Vital Signs BP 151/66   Pulse 71   Temp (!) 94.1 F (34.5 C)   Resp 16   Wt 150 lb (68 kg)   SpO2 100%   BMI 28.34 kg/m   Physical Exam  Constitutional: She appears well-developed and well-nourished.  HENT:  Head: Normocephalic.  Mild dry mucous membranes.   Eyes:  Dilated, unresponsive pupils. Pupils ~4 mm bilaterally, no response to light.   Neck: Neck supple.  Cardiovascular: Bradycardia present.   Pulmonary/Chest:  Decreased effort.   Abdominal: Soft. She exhibits no distension.  Musculoskeletal:  Pt withdraws to painful stimuli.  Minimal flexion of knees with painful stimuli.   Neurological: A cranial nerve deficit is present.  Unresponsive, no spontaneous movement. Difficult neuro exam due to presentation and AMS, 2+ radial pulse.   Psychiatric:  Unresponsive.   Nursing note and vitals reviewed.  ED Treatments / Results  Labs (all labs ordered are listed, but only abnormal results are displayed) Labs Reviewed  CBC - Abnormal; Notable for the following:       Result Value   RBC 3.26 (*)    Hemoglobin 9.7 (*)    HCT 29.2 (*)    RDW 16.4 (*)    All other components within normal limits  COMPREHENSIVE METABOLIC PANEL - Abnormal; Notable for the following:    Potassium 3.3 (*)    Glucose, Bld 186 (*)    BUN 33 (*)    Creatinine, Ser 1.21 (*)    Total Protein 6.2 (*)    ALT 11 (*)    GFR calc non Af Amer 41 (*)    GFR calc Af Amer 47 (*)    All other components within normal limits  URINALYSIS, ROUTINE W REFLEX MICROSCOPIC (NOT AT [facility]) - Abnormal; Notable for the following:    Color, Urine STRAW (*)    Hgb urine dipstick SMALL (*)    Ketones, ur TRACE (*)    Protein, ur 100 (*)    All other components within normal limits  URINE  MICROSCOPIC-ADD ON - Abnormal; Notable for the following:    Squamous Epithelial / LPF 0-5 (*)    Bacteria, UA FEW (*)    All other components within normal limits  I-STAT CHEM 8, ED - Abnormal; Notable for the following:    Potassium 3.3 (*)    BUN 31 (*)    Creatinine, Ser 1.40 (*)    Glucose, Bld 181 (*)    Hemoglobin 8.8 (*)    HCT 26.0 (*)    All other components within normal limits  ETHANOL  PROTIME-INR  APTT  DIFFERENTIAL  RAPID URINE DRUG SCREEN, HOSP PERFORMED  I-STAT TROPOININ, ED  I-STAT CG4 LACTIC ACID, ED    EKG  EKG Interpretation  Date/Time:  02/21/16 11:09:32 EST Ventricular Rate:  80 PR Interval:    QRS Duration: 136 QT Interval:  468 QTC Calculation: 540 R Axis:   49 Text Interpretation:  Sinus rhythm Right bundle branch block Inferior infarct, acute Probable posterior infarct, acute Lateral leads are also involved Confirmed by Javien Tesch MD, Kelyn Koskela [phone]) on 2016-02-21 11:13:05 AM       Radiology Dg Chest Portable 1 View  Result Date: 2016/02/21 CLINICAL DATA:  Post intubation and OG tube. EXAM: PORTABLE CHEST 1 VIEW COMPARISON:  08/13/ 2016 FINDINGS: Endotracheal tube is just into the right mainstem bronchus. Recommend retracting approximately 3 cm. Left lower lobe atelectasis. OG tube tip is in the proximal stomach. Right lung is clear. No effusions. Heart is borderline in size. IMPRESSION: Endotracheal tube just into the right mainstem bronchus. Recommend retracting approximately 3 cm. Left lower lobe atelectasis. Critical Value/emergent results were called by telephone at the time of interpretation on 02-21-2016 at 11:32 am to Dr. Elnora Woods , who verbally acknowledged these results. Electronically Signed   By: Rolm Baptise M.D.   On: 02/21/16 11:32   Ct Head Code Stroke W/o Cm  Result Date: 02/21/16 CLINICAL DATA:  Code stroke.  Unresponsive. EXAM: CT HEAD WITHOUT CONTRAST TECHNIQUE: Contiguous axial images were obtained from  the base of the skull through the vertex without intravenous contrast. COMPARISON:  None. FINDINGS: Brain: Extensive acute, high-density clot throughout the right cerebral hemisphere centered in the posterior frontal and parietal lobes. Hematoma volume is difficult to assess due to insinuating appearance throughout this area. Hematoma involves a up to 11 cm area. There prominent degree of low-density for presenting scan, especially along the deep portion of the hemorrhage. On reformats this edematous change appears to affect the cortex. Small volume subarachnoid extension. Midline shift measures up to 29 mm and there is dilatation of the left temporal horn. Right uncal herniation and brainstem mass effect. Low-density extra-axial fluid in the right frontal region which may be chronic or reactive to the shift. Vascular: Atherosclerotic calcification. Skull: No posttraumatic finding. Sinuses/Orbits: No acute finding Other: Critical Value/emergent results were called by telephone at the time of interpretation on February 21, 2016 at 11:31 am to Dr. Elnora Woods , who verbally acknowledged these results. IMPRESSION: Extensive acute parenchymal hemorrhage in the high right cerebral hemisphere with subfalcine and uncal herniation. Given the patchy hemorrhage and prominent edema, suspect hemorrhagic transformation of a MCA infarct. Electronically Signed   By: Monte Fantasia M.D.   On: 2016-02-21 11:40    Procedures  CRITICAL CARE Performed by: Mariea Clonts   Total critical care time: 105 minutes  Critical care time was exclusive of separately billable procedures and treating other patients.  Critical care was necessary to treat or prevent imminent or life-threatening deterioration.  Critical care was time spent personally by me on the following activities: development of treatment Woods with patient and/or surrogate as well as nursing, discussions with consultants, evaluation of patient's response to  treatment, examination of patient, obtaining history from patient or surrogate, ordering and performing treatments and interventions, ordering and review of laboratory studies, ordering and review of radiographic studies, pulse oximetry and re-evaluation of patient's condition.  Procedure Name: Intubation Date/Time: 2016-02-21 11:15 AM Performed by: Vicki Woods Pre-anesthesia Checklist: Patient identified and Emergency Drugs available Oxygen Delivery Method: Non-rebreather mask Preoxygenation: Pre-oxygenation with 100% oxygen Intubation Type: Rapid sequence Ventilation: Mask ventilation without difficulty Laryngoscope Size: 3 and Mac Grade View: Grade II Tube size: 7.0 mm  Number of attempts: 1 Placement Confirmation: ETT inserted through vocal cords under direct vision,  Positive ETCO2,  Breath sounds checked- equal and bilateral and CO2 detector Secured at: 22 cm Tube secured with: ETT holder      POST PROCEDURE CXR: pending   Medications Ordered in ED Medications  fentaNYL (SUBLIMAZE) 2,500 mcg in sodium chloride 0.9 % 250 mL (10 mcg/mL) infusion (40 mcg/hr Intravenous New Bag/Given 2016/03/01 1200)  nicardipine (CARDENE) 20mg  in 0.86% saline 2103ml IV infusion (0.1 mg/ml) (not administered)  etomidate (AMIDATE) injection (10 mg Intravenous Given March 01, 2016 1103)  rocuronium (ZEMURON) injection (70 mg Intravenous Given 03/01/16 1104)  0.9 %  sodium chloride infusion ( Intravenous New Bag/Given 2016-03-01 1132)    DIAGNOSTIC STUDIES:  Oxygen Saturation is 95% on [place], adequate by my interpretation.    COORDINATION OF CARE:  11:09 AM Discussed treatment Woods with pt at bedside and pt agreed to Woods.  Initial Impression / Assessment and Woods / ED Course  I have reviewed the triage vital signs and the nursing notes.  Pertinent labs & imaging results that were available during my care of the patient were reviewed by me and considered in my medical decision making (see chart for  details).  Clinical Course    Patient presented unresponsive clinical concern for significant stroke/hemorrhage. Patient bradycardic, unresponsive and challenging neuro exam. Code stroke called to the last known normal at 10:00 this morning. Patient intubated for airway protection and critical illness. Patient sent emergently to CT scan and neuro consult at for code stroke. No family or DO NOT RESUSCITATE status available.  Patient's blood pressure approximate [id number] systolic. Cardene held and less blood pressure increases above 140. Page neurosurgery and critical care for transfer to [facility] in ICU admission. Spoke with radiologist to adjust endotracheal tube and regarding significant hemorrhage. Discussed with neurology details. Patient's vitals stable on reassessment. Awaiting family to arrive.  Spoke with neurosurgery patient's pathology is non-survivable. Discussed the case in detail with patient's only family member from [place]. After identifying name date of birth and general details discussed the case over the phone. Her sister said she would not want any aggressive care done with this diagnosis. She recommended weaning off ventilator and further care and keep her comfortable. Discussed this with [facility] and page palliative care.   The patients results and Woods were reviewed and discussed.   Any x-rays performed were independently reviewed by myself.   Differential diagnosis were considered with the presenting HPI.  Medications  fentaNYL (SUBLIMAZE) 2,500 mcg in sodium chloride 0.9 % 250 mL (10 mcg/mL) infusion (40 mcg/hr Intravenous New Bag/Given 03-01-2016 1200)  nicardipine (CARDENE) 20mg  in 0.86% saline 276ml IV infusion (0.1 mg/ml) (not administered)  etomidate (AMIDATE) injection (10 mg Intravenous Given 03-01-16 1103)  rocuronium (ZEMURON) injection (70 mg Intravenous Given March 01, 2016 1104)  0.9 %  sodium chloride infusion ( Intravenous New Bag/Given 2016-03-01  1132)    Vitals:   03-01-16 1245 03/01/16 1300 03/01/2016 1315 01-Mar-2016 1330  BP: 153/82 163/81 158/69 151/66  Pulse: 94 91 79 71  Resp: 18 18 18 16   Temp: (!) 94.3 F (34.6 C) (!) 94.3 F (34.6 C) (!) 94.3 F (34.6 C) (!) 94.1 F (34.5 C)  SpO2: 100% 100% 100% 100%  Weight:        Final diagnoses:  Acute CVA (cerebrovascular accident) ([place])  Intracranial hemorrhage ([place])  Unresponsive state    Admission/ observation were discussed with the admitting physician, patient and/or family and they are  comfortable with the Woods.    Final Clinical Impressions(s) / ED Diagnoses   Final diagnoses:  Acute CVA (cerebrovascular accident) ([place])  Intracranial hemorrhage ([place])  Unresponsive state    New Prescriptions New Prescriptions   No medications on file     Vicki Morrison, MD 02-26-16 1358

## 2016-02-18 DEATH — deceased

## 2016-05-03 ENCOUNTER — Ambulatory Visit (HOSPITAL_COMMUNITY): Payer: Medicare Other | Admitting: Oncology

## 2016-05-03 ENCOUNTER — Other Ambulatory Visit (HOSPITAL_COMMUNITY): Payer: Medicare Other

## 2017-03-30 IMAGING — CT CT HEAD CODE STROKE
3 series · 15 of 47 positions shown, 18 images · non-contrast
Comparison: None.

CLINICAL DATA: Code stroke.  Unresponsive.

EXAM:
CT HEAD WITHOUT CONTRAST
TECHNIQUE: Contiguous axial images were obtained from the base of the skull
through the vertex without intravenous contrast.

[Series 2: head code stroke wo · axial · 0.47mm/px · z∈[+175,+300]mm · 9 of 31 slices shown, 12 images]
[im 3/31  brain]
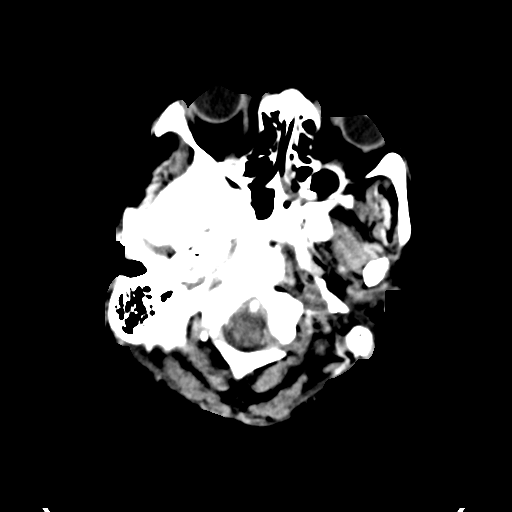
[im 3/31  bone]
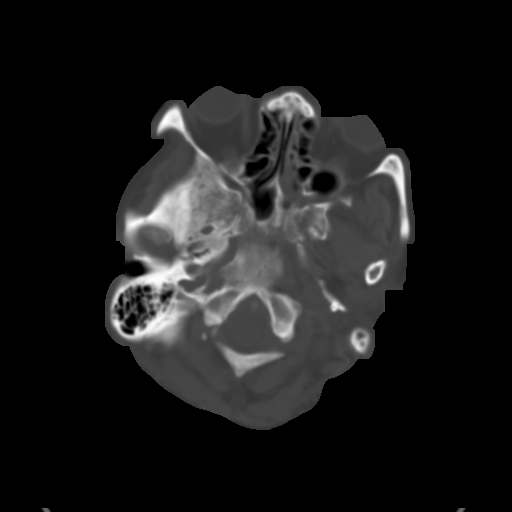
[im 6/31  brain]
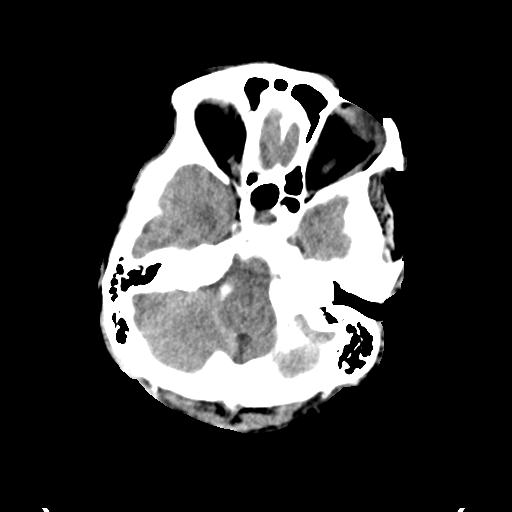
[im 9/31  brain]
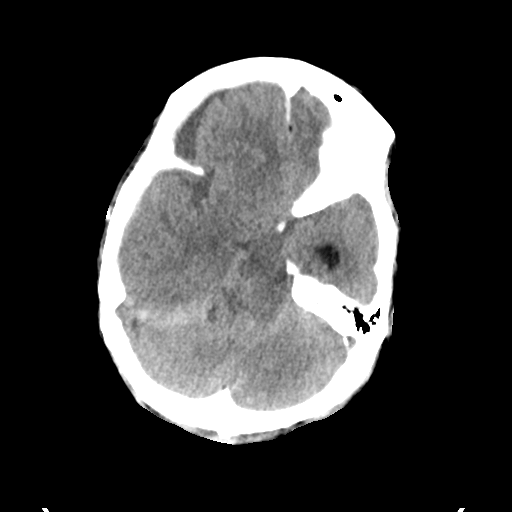
[im 12/31  brain]
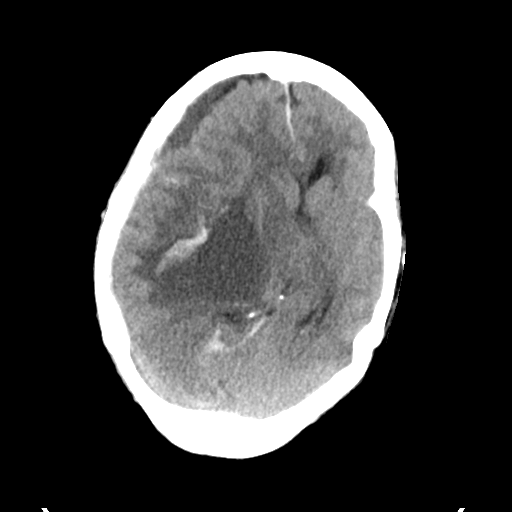
[im 16/31  brain]
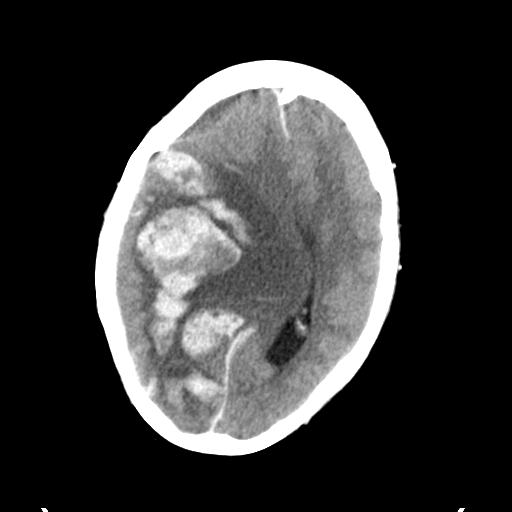
[im 16/31  bone]
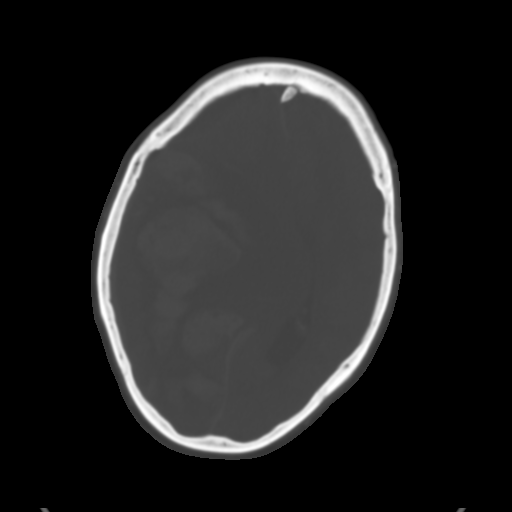
[im 19/31  brain]
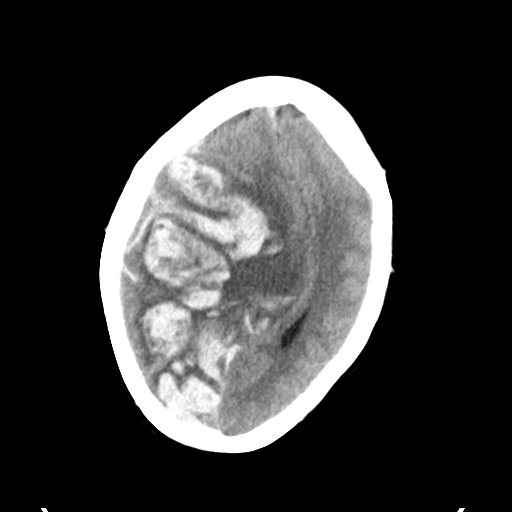
[im 22/31  brain]
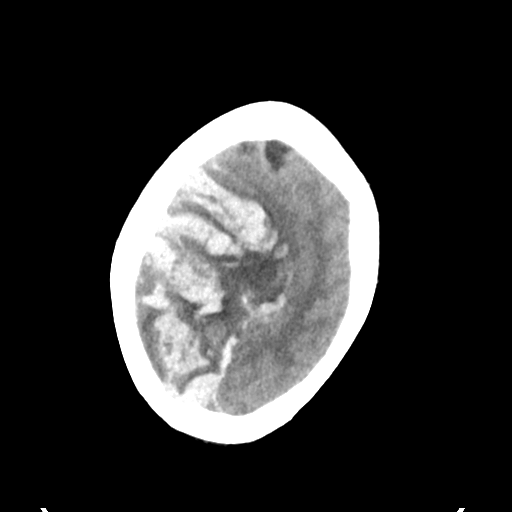
[im 25/31  brain]
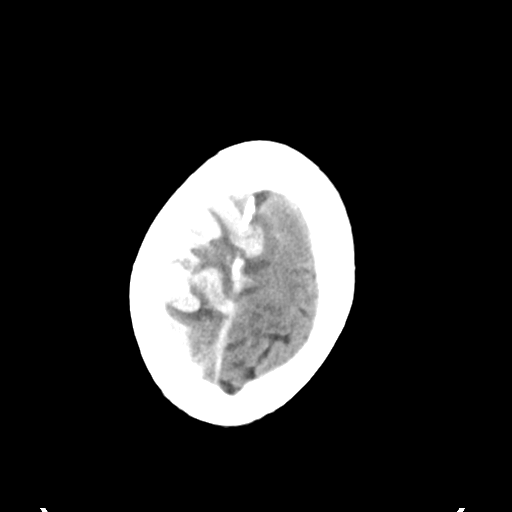
[im 28/31  brain]
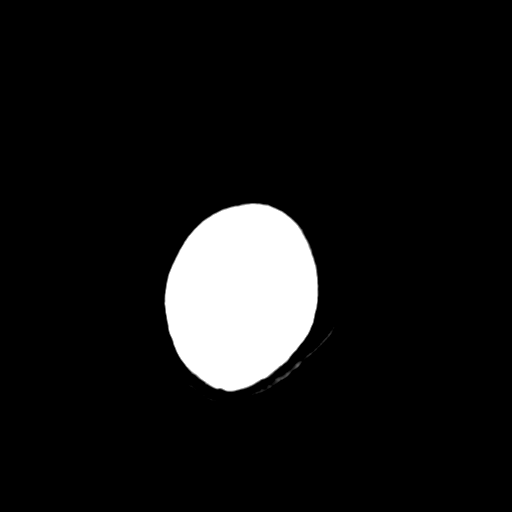
[im 28/31  bone]
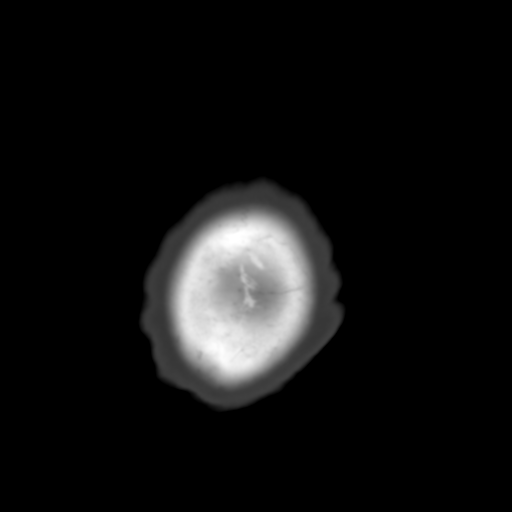

[Series 4: coronal soft tissue · coronal · 0.32mm/px · 3 of 67 slices shown]
[im 23/67  brain]
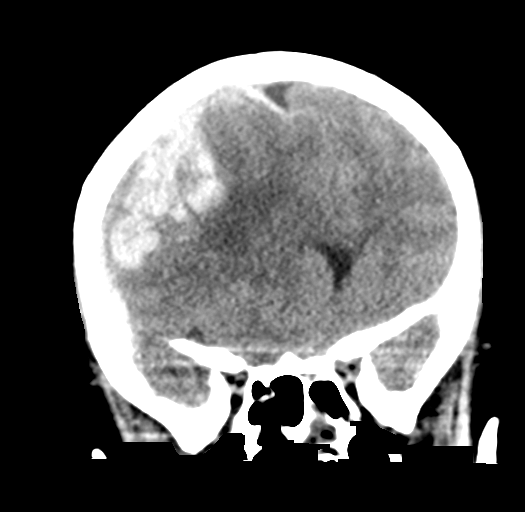
[im 30/67  brain]
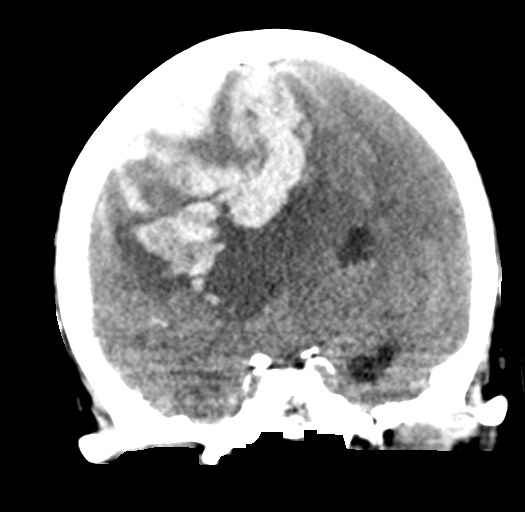
[im 37/67  brain]
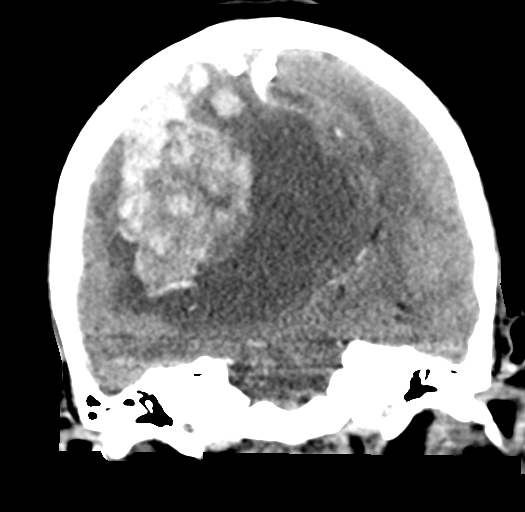

[Series 5: sagittal soft tissue · sagittal · 0.33mm/px · 3 of 54 slices shown]
[im 18/54  brain]
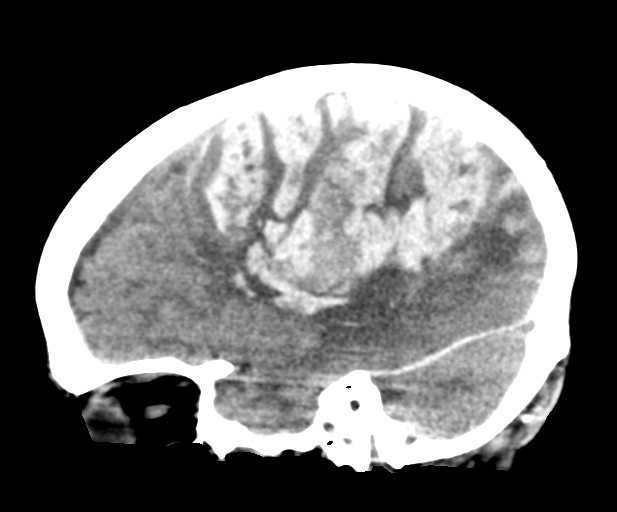
[im 27/54  brain]
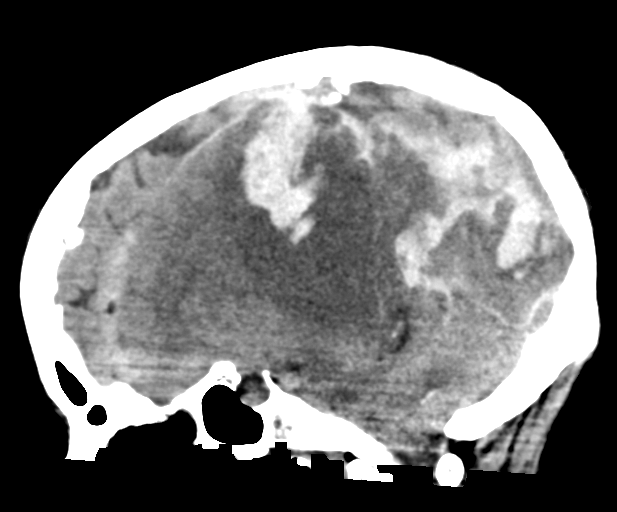
[im 36/54  brain]
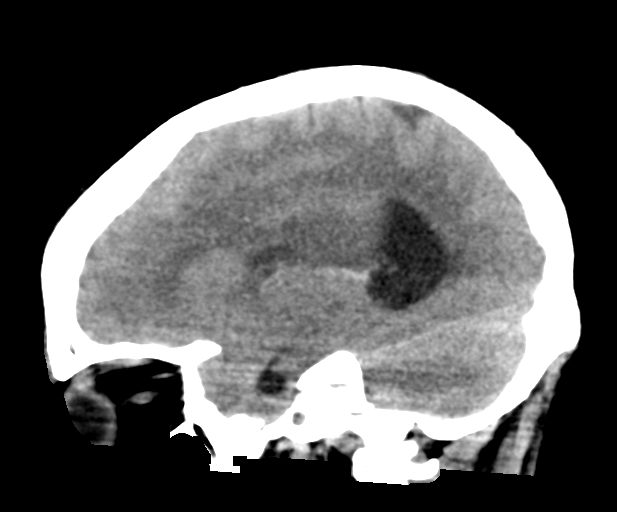

[15 of 47 positions shown; findings below may reference images not displayed]

FINDINGS: Brain: Extensive acute, high-density clot throughout the right
cerebral hemisphere centered in the posterior frontal and parietal
lobes. Hematoma volume is difficult to assess due to insinuating
appearance throughout this area. Hematoma involves a up to 11 cm
area. There prominent degree of low-density for presenting scan,
especially along the deep portion of the hemorrhage. On reformats
this edematous change appears to affect the cortex. Small volume
subarachnoid extension. Midline shift measures up to 29 mm and there
is dilatation of the left temporal horn. Right uncal herniation and
brainstem mass effect. Low-density extra-axial fluid in the right
frontal region which may be chronic or reactive to the shift.

Vascular: Atherosclerotic calcification.

Skull: No posttraumatic finding.

Sinuses/Orbits: No acute finding

Other: Critical Value/emergent results were called by telephone at
the time of interpretation on 02/14/2016 at [DATE] to Dr. DINA
ASHI , who verbally acknowledged these results.
IMPRESSION: Extensive acute parenchymal hemorrhage in the high right cerebral
hemisphere with subfalcine and uncal herniation. Given the patchy
hemorrhage and prominent edema, suspect hemorrhagic transformation
of a MCA infarct.
